# Patient Record
Sex: Male | Born: 1950 | Race: Black or African American | Hispanic: No | Smoking: Current every day smoker
Health system: Southern US, Community
[De-identification: ages and names within clinical notes are randomized; demographics above are authoritative.]

## PROBLEM LIST (undated history)

## (undated) DIAGNOSIS — I10 Essential (primary) hypertension: Secondary | ICD-10-CM

## (undated) DIAGNOSIS — E119 Type 2 diabetes mellitus without complications: Secondary | ICD-10-CM

## (undated) DIAGNOSIS — I509 Heart failure, unspecified: Secondary | ICD-10-CM

---

## 1984-07-07 HISTORY — PX: ANKLE FRACTURE SURGERY: SHX122

## 1984-07-07 HISTORY — PX: WRIST FRACTURE SURGERY: SHX121

## 1984-07-07 HISTORY — PX: ORIF TRIPOD FRACTURE: SHX5211

## 2002-11-25 ENCOUNTER — Encounter: Payer: Self-pay | Admitting: Emergency Medicine

## 2002-11-25 ENCOUNTER — Emergency Department (HOSPITAL_COMMUNITY): Admission: EM | Admit: 2002-11-25 | Discharge: 2002-11-25 | Payer: Self-pay | Admitting: Emergency Medicine

## 2007-12-16 ENCOUNTER — Emergency Department (HOSPITAL_COMMUNITY): Admission: EM | Admit: 2007-12-16 | Discharge: 2007-12-16 | Payer: Self-pay | Admitting: Emergency Medicine

## 2008-04-16 ENCOUNTER — Emergency Department (HOSPITAL_COMMUNITY): Admission: EM | Admit: 2008-04-16 | Discharge: 2008-04-16 | Payer: Self-pay | Admitting: Emergency Medicine

## 2010-08-07 DIAGNOSIS — I509 Heart failure, unspecified: Secondary | ICD-10-CM

## 2010-08-07 HISTORY — DX: Heart failure, unspecified: I50.9

## 2010-09-27 ENCOUNTER — Inpatient Hospital Stay (HOSPITAL_COMMUNITY)
Admission: EM | Admit: 2010-09-27 | Discharge: 2010-10-02 | DRG: 291 | Disposition: A | Discharge status: Home / Self Care | Payer: Self-pay | Attending: Family Medicine | Admitting: Family Medicine

## 2010-09-27 ENCOUNTER — Emergency Department (HOSPITAL_COMMUNITY): Payer: Self-pay

## 2010-09-27 DIAGNOSIS — I130 Hypertensive heart and chronic kidney disease with heart failure and stage 1 through stage 4 chronic kidney disease, or unspecified chronic kidney disease: Principal | ICD-10-CM | POA: Diagnosis present

## 2010-09-27 DIAGNOSIS — F17200 Nicotine dependence, unspecified, uncomplicated: Secondary | ICD-10-CM | POA: Diagnosis present

## 2010-09-27 DIAGNOSIS — I5023 Acute on chronic systolic (congestive) heart failure: Secondary | ICD-10-CM | POA: Diagnosis present

## 2010-09-27 DIAGNOSIS — K8020 Calculus of gallbladder without cholecystitis without obstruction: Secondary | ICD-10-CM | POA: Diagnosis present

## 2010-09-27 DIAGNOSIS — E876 Hypokalemia: Secondary | ICD-10-CM | POA: Diagnosis present

## 2010-09-27 DIAGNOSIS — I509 Heart failure, unspecified: Secondary | ICD-10-CM | POA: Diagnosis present

## 2010-09-27 DIAGNOSIS — Z7982 Long term (current) use of aspirin: Secondary | ICD-10-CM

## 2010-09-27 DIAGNOSIS — R7402 Elevation of levels of lactic acid dehydrogenase (LDH): Secondary | ICD-10-CM | POA: Diagnosis present

## 2010-09-27 DIAGNOSIS — R7401 Elevation of levels of liver transaminase levels: Secondary | ICD-10-CM | POA: Diagnosis present

## 2010-09-27 DIAGNOSIS — R0902 Hypoxemia: Secondary | ICD-10-CM | POA: Diagnosis present

## 2010-09-27 DIAGNOSIS — Z79899 Other long term (current) drug therapy: Secondary | ICD-10-CM

## 2010-09-27 DIAGNOSIS — N183 Chronic kidney disease, stage 3 unspecified: Secondary | ICD-10-CM | POA: Diagnosis present

## 2010-09-27 LAB — URINE MICROSCOPIC-ADD ON

## 2010-09-27 LAB — CBC
HCT: 50.9 % (ref 39.0–52.0)
MCHC: 35.4 g/dL (ref 30.0–36.0)
RDW: 13.8 % (ref 11.5–15.5)

## 2010-09-27 LAB — COMPREHENSIVE METABOLIC PANEL
ALT: 54 U/L — ABNORMAL HIGH (ref 0–53)
AST: 52 U/L — ABNORMAL HIGH (ref 0–37)
Alkaline Phosphatase: 128 U/L — ABNORMAL HIGH (ref 39–117)
CO2: 27 mEq/L (ref 19–32)
Chloride: 103 mEq/L (ref 96–112)
GFR calc Af Amer: 57 mL/min — ABNORMAL LOW (ref >60)
GFR calc non Af Amer: 47 mL/min — ABNORMAL LOW (ref >60)
Glucose, Bld: 105 mg/dL — ABNORMAL HIGH (ref 70–99)
Sodium: 138 mEq/L (ref 135–145)
Total Bilirubin: 1.5 mg/dL — ABNORMAL HIGH (ref 0.3–1.2)

## 2010-09-27 LAB — DIFFERENTIAL
Basophils Absolute: 0.1 10*3/uL (ref 0.0–0.1)
Basophils Relative: 1 % (ref 0–1)
Eosinophils Relative: 1 % (ref 0–5)
Lymphocytes Relative: 45 % (ref 12–46)
Monocytes Absolute: 0.9 10*3/uL (ref 0.1–1.0)

## 2010-09-27 LAB — URINALYSIS, ROUTINE W REFLEX MICROSCOPIC
Ketones, ur: NEGATIVE mg/dL
Nitrite: NEGATIVE
Protein, ur: 100 mg/dL — AB
Urobilinogen, UA: 1 mg/dL (ref 0.0–1.0)

## 2010-09-27 LAB — POCT CARDIAC MARKERS
CKMB, poc: <1 ng/mL — ABNORMAL LOW (ref 1.0–8.0)
Myoglobin, poc: 61.9 ng/mL (ref 12–200)
Troponin i, poc: <0.05 ng/mL (ref 0.00–0.09)

## 2010-09-28 ENCOUNTER — Inpatient Hospital Stay (HOSPITAL_COMMUNITY): Payer: Self-pay

## 2010-09-28 LAB — CARDIAC PANEL(CRET KIN+CKTOT+MB+TROPI)
CK, MB: 1.7 ng/mL (ref 0.3–4.0)
CK, MB: 1.8 ng/mL (ref 0.3–4.0)
CK, MB: 1.5 ng/mL (ref 0.3–4.0)
Relative Index: 1.2 (ref 0.0–2.5)
Total CK: 145 U/L (ref 7–232)
Total CK: 140 U/L (ref 7–232)
Troponin I: 0.07 ng/mL — ABNORMAL HIGH (ref 0.00–0.06)

## 2010-09-28 LAB — COMPREHENSIVE METABOLIC PANEL
ALT: 46 U/L (ref 0–53)
AST: 44 U/L — ABNORMAL HIGH (ref 0–37)
Alkaline Phosphatase: 100 U/L (ref 39–117)
CO2: 25 mEq/L (ref 19–32)
Chloride: 107 mEq/L (ref 96–112)
Creatinine, Ser: 1.42 mg/dL (ref 0.4–1.5)
GFR calc Af Amer: >60 mL/min (ref >60)
GFR calc non Af Amer: 51 mL/min — ABNORMAL LOW (ref >60)
Sodium: 138 mEq/L (ref 135–145)
Total Bilirubin: 1.4 mg/dL — ABNORMAL HIGH (ref 0.3–1.2)

## 2010-09-28 LAB — DIFFERENTIAL
Basophils Absolute: 0.1 10*3/uL (ref 0.0–0.1)
Lymphocytes Relative: 46 % (ref 12–46)
Monocytes Absolute: 0.6 10*3/uL (ref 0.1–1.0)
Neutro Abs: 2.1 10*3/uL (ref 1.7–7.7)

## 2010-09-28 LAB — CBC
HCT: 46.1 % (ref 39.0–52.0)
Hemoglobin: 16.1 g/dL (ref 13.0–17.0)
MCHC: 34.9 g/dL (ref 30.0–36.0)

## 2010-09-28 LAB — HEMOGLOBIN A1C: Mean Plasma Glucose: 126 mg/dL — ABNORMAL HIGH (ref <117)

## 2010-09-28 LAB — MAGNESIUM: Magnesium: 2.4 mg/dL (ref 1.5–2.5)

## 2010-09-28 LAB — TSH: TSH: 0.895 u[IU]/mL (ref 0.350–4.500)

## 2010-09-28 LAB — CK TOTAL AND CKMB (NOT AT ARMC): Relative Index: 1.1 (ref 0.0–2.5)

## 2010-09-28 MED ORDER — XENON XE 133 GAS
10.0000 | GAS_FOR_INHALATION | Freq: Once | RESPIRATORY_TRACT | Status: AC | PRN
  Administered 2010-09-28: 10 via RESPIRATORY_TRACT

## 2010-09-28 MED ORDER — TECHNETIUM TO 99M ALBUMIN AGGREGATED
6.0000 | Freq: Once | INTRAVENOUS | Status: AC | PRN
  Administered 2010-09-28: 6 via INTRAVENOUS

## 2010-09-29 LAB — COMPREHENSIVE METABOLIC PANEL
ALT: 37 U/L (ref 0–53)
AST: 29 U/L (ref 0–37)
Albumin: 3.1 g/dL — ABNORMAL LOW (ref 3.5–5.2)
Alkaline Phosphatase: 88 U/L (ref 39–117)
Chloride: 104 mEq/L (ref 96–112)
GFR calc Af Amer: 52 mL/min — ABNORMAL LOW (ref >60)
Potassium: 3.5 mEq/L (ref 3.5–5.1)
Total Bilirubin: 1.6 mg/dL — ABNORMAL HIGH (ref 0.3–1.2)

## 2010-09-29 LAB — CBC
Hemoglobin: 15 g/dL (ref 13.0–17.0)
MCH: 29.1 pg (ref 26.0–34.0)
MCV: 84.9 fL (ref 78.0–100.0)
RBC: 5.16 MIL/uL (ref 4.22–5.81)

## 2010-09-29 LAB — CARDIAC PANEL(CRET KIN+CKTOT+MB+TROPI): Relative Index: 1.2 (ref 0.0–2.5)

## 2010-09-29 LAB — RAPID URINE DRUG SCREEN, HOSP PERFORMED: Tetrahydrocannabinol: NOT DETECTED

## 2010-09-30 ENCOUNTER — Inpatient Hospital Stay (HOSPITAL_COMMUNITY): Payer: Self-pay

## 2010-09-30 LAB — MAGNESIUM: Magnesium: 2.3 mg/dL (ref 1.5–2.5)

## 2010-09-30 LAB — BASIC METABOLIC PANEL
Chloride: 102 mEq/L (ref 96–112)
Creatinine, Ser: 1.57 mg/dL — ABNORMAL HIGH (ref 0.4–1.5)
GFR calc Af Amer: 55 mL/min — ABNORMAL LOW (ref >60)
Potassium: 3.1 mEq/L — ABNORMAL LOW (ref 3.5–5.1)

## 2010-10-01 ENCOUNTER — Inpatient Hospital Stay (HOSPITAL_COMMUNITY): Payer: Self-pay

## 2010-10-01 LAB — BASIC METABOLIC PANEL
GFR calc non Af Amer: 53 mL/min — ABNORMAL LOW (ref >60)
Glucose, Bld: 102 mg/dL — ABNORMAL HIGH (ref 70–99)
Potassium: 3.3 mEq/L — ABNORMAL LOW (ref 3.5–5.1)
Sodium: 133 mEq/L — ABNORMAL LOW (ref 135–145)

## 2010-10-01 LAB — CBC
HCT: 44.3 % (ref 39.0–52.0)
Hemoglobin: 15 g/dL (ref 13.0–17.0)
RDW: 13.9 % (ref 11.5–15.5)
WBC: 5 10*3/uL (ref 4.0–10.5)

## 2010-10-01 MED ORDER — TECHNETIUM TC 99M TETROFOSMIN IV KIT
10.0000 | PACK | Freq: Once | INTRAVENOUS | Status: AC | PRN
  Administered 2010-10-01: 10 via INTRAVENOUS

## 2010-10-01 MED ORDER — TECHNETIUM TC 99M TETROFOSMIN IV KIT
30.0000 | PACK | Freq: Once | INTRAVENOUS | Status: AC | PRN
  Administered 2010-10-01: 30 via INTRAVENOUS

## 2010-10-02 ENCOUNTER — Other Ambulatory Visit (HOSPITAL_COMMUNITY): Payer: Self-pay

## 2010-10-02 LAB — CBC
Hemoglobin: 15.4 g/dL (ref 13.0–17.0)
MCH: 29.7 pg (ref 26.0–34.0)
RBC: 5.18 MIL/uL (ref 4.22–5.81)
WBC: 6 10*3/uL (ref 4.0–10.5)

## 2010-10-02 LAB — BASIC METABOLIC PANEL
CO2: 26 mEq/L (ref 19–32)
Chloride: 105 mEq/L (ref 96–112)
Creatinine, Ser: 1.62 mg/dL — ABNORMAL HIGH (ref 0.4–1.5)
GFR calc Af Amer: 53 mL/min — ABNORMAL LOW (ref >60)
Potassium: 3.7 mEq/L (ref 3.5–5.1)
Sodium: 138 mEq/L (ref 135–145)

## 2010-10-03 NOTE — H&P (Signed)
[name] Barnett, [name] Barnett                  ACCOUNT NO.:  [id number]  MEDICAL RECORD NO.:  [id number]           PATIENT TYPE:  E  LOCATION:  [facility]                         FACILITY:  [facility]  PHYSICIAN:  Michiel Cowboy, MDDATE OF BIRTH:  1951/03/21  DATE OF ADMISSION:  09/27/2010 DATE OF DISCHARGE:                             HISTORY & PHYSICAL   PRIMARY CARE PROVIDER:  None.  CHIEF COMPLAINT:  Shortness of breath and leg swelling.  The patient is a 60 year old gentleman, who since January has been having worsening shortness of breath with exertion as well as trouble sleeping flat with upright propped up for the past 2 weeks.  He had been also having worsening lower extremity edema and shortness of breath.  He started to use his girlfriend's oxygen, which seem to have helped him. He endorses occasional chest pains, though none today, they are more likely chest aches.  He was seen in the emergency department by Dr. Shana Chute who wrote for IV Lasix and recommends a cardiac workup for possible CHF.  REVIEW OF SYSTEMS:  He does have occasional cough especially at night. Otherwise, no nausea, vomiting, or diarrhea.  Review of systems otherwise negative except for HPI.  PAST MEDICAL HISTORY:  The patient is unaware of any.  SOCIAL HISTORY:  The patient continues to smoke, thinking about quitting now.  Does not abuse drugs, but drinks about a beer a day.  FAMILY HISTORY:  Noncontributory.  No coronary artery disease that he knows of.  ALLERGIES:  None.  MEDICATIONS:  None.  PHYSICAL EXAMINATION:  VITALS:  Temperature 98.2, blood pressure 135/101, respiration 20.  Initial heart rate 101, now down to 93.  Pulse ox 99% on 2 liters and 90% on room air. GENERAL:  The patient appears to be in no acute distress. HEENT:  Head, nontraumatic.  Moist mucous membranes. LUNGS:  I do not appreciate any crackles or wheezes. HEART:  Regular rhythm.  No murmurs appreciated. ABDOMEN:  Soft,  nontender, nondistended. LOWER EXTREMITIES:  About 1+ edema bilaterally. NEUROLOGIC:  Grossly intact.  LABS:  White blood cell count 6.1, hemoglobin 18.  Sodium 138, potassium 5.1, creatinine 1.3, total bili 1.5, alk phos 128, AST 52, ALT 54 and slightly elevated, albumin 3.8, BNP elevated to 1798.  EKG showing heart rate of 95, left ventricular hypertrophy with no reciprocal ST depressions, but there is some ST changes in V1, V2, and V3, reviewed by Cardiology.  ASSESSMENT AND PLAN:  This is a 60 year old gentleman with possible congestive heart failure presentation.  We will admit cycle cardiac markers.  Continue Lasix IV, written by Cardiology.  Write for 2-D echo, TSH, check hemoglobin A1c, fasting lipid panel, UDS.  Slight LFT elevations, could be secondary to liver parenchyma structure secondary to heart failure, most likely right-sided, but given history of alcohol use, we will check abdominal ultrasound.  Prophylaxis, Protonix and Lovenox.  Hypoxia.  We will check a D-dimer.  His chest x-ray was not consistent with pulmonary edema.  I wonder if he may have some COPD contribution, although he is not wheezing.  We will check a D-dimer for  completion sake.  We will write for p.r.n. albuterol and scheduled Atrovent to see if this helps him.     Michiel Cowboy, MD     AVD/MEDQ  D:  09/27/2010  T:  09/28/2010  Job:  [id number]  cc:   Osvaldo Shipper. Spruill, M.D.  Electronically Signed by Therisa Doyne MD on 10/03/2010 02:40:18 AM

## 2010-10-09 NOTE — Discharge Summary (Signed)
[name] Barnett, [name] Barnett                  ACCOUNT NO.:  [id number]  MEDICAL RECORD NO.:  [id number]           PATIENT TYPE:  I  LOCATION:  4708                         FACILITY:  [facility]  PHYSICIAN:  Mauro Kaufmann, MD         DATE OF BIRTH:  1951/01/21  DATE OF ADMISSION:  09/27/2010 DATE OF DISCHARGE:  10/02/2010                              DISCHARGE SUMMARY   ADMISSION DIAGNOSES: 1. Congestive heart failure exacerbation. 2. Transaminitis.  DISCHARGE DIAGNOSES: 1. Congestive heart failure exacerbation. resolved. 2. Transaminitis, resolved. 3. Negative nuclear stress test. 4. Hypertension. 5. Cholelithiasis with no evidence of acute infection. 6. Tobacco abuse. 7. Chronic kidney disease stage III.  CONSULTATIONS OBTAINED DURING STAY:  Cardiology consultation.  TESTS PERFORMED DURING THE HOSPITAL STAY: 1. Chest x-ray on March 23 showed cardiomegaly without CHF or     pneumonia. 2. Abdomen ultrasound on March 24 showed sludge and small stones in     the gallbladder.  No evidence of acute cholecystitis. 3. Pulmonary perfusion scan on March 24 showed low probability for     pulmonary embolism. 4. Myocardial Myoview stress test showed no evidence of reversible     ischemia or infarction.  Left ventricular dilatation with global     hypokinesia.  Left ventricular ejection fraction of 29%.  Findings     suggestive of dilated cardiomyopathy.  BRIEF HISTORY AND PHYSICAL:  This is a 60 year old gentleman who came to the hospital with shortness of breath, exertion and sleeping flat with upright propped up for past 2 weeks and the patient also had worsening of the lower extremity edema and shortness of breath.  The patient started to use girlfriend's oxygen, it seem to have helped him.  The patient was found to have CHF exacerbation and was started on IV Lasix and was admitted for further evaluation.  BRIEF HOSPITAL COURSE: 1. CHF exacerbation.  The patient actually had echocardiogram done  on     March 24, which showed EF of 20-25%, diffuse hypokinesis.  The     patient also had a myocardial stress test which showed EF of 29%     and showed no reversible ischemia.  Cardiology followed the patient     and at this time Cardiology happen to send the patient home on     medications and to follow up with the patient 3 months' time to     assess the patient's ejection fraction by doing an echocardiogram     and this patient needs AICD and whether with the patient needs     AICD, so he will follow up with Dr. Sharyn Lull as outpatient in 3     months' time.  In the meantime, the patient will be sent home on     Lasix, spironolactone, Benicar as well as a isosorbide hydralazine.     The patient already has an appointment set up at the [facility]     and follow up with them as outpatient. 2. Transaminitis.  The patient admitted and was found to have elevated     liver enzymes, which was thought to be  due to the hepatopathy     because of the CHF.  At this time, his liver enzymes have resolved.     The patient was found to have mild sludge in the gallbladder with     small stones, but there is no active issue or no active     cholecystitis at this time.  The patient at this time has no active     issue with the gallstones and this needs to be monitor as long     term. 3. Chronic kidney disease stage III.  The patient has a CKD history.     History is stable at this time. 4. Hypertension.  Blood pressure is stable.  He will continue on     Benicar, hydralazine, and Coreg as ordered.  MEDICATIONS ON DISCHARGE: 1. Aspirin 325 mg p.o. daily. 2. Coreg 12.5 mg p.o. b.i.d. 3. BiDil 1 tablet p.o. t.i.d. 4. Lasix 40 mg p.o. twice a day. 5. Olmesartan 200 mg p.o. daily. 6. Potassium chloride 40 mEq p.o. daily. 7. Spironolactone 25 mg p.o. daily. 8. Mucinex 600 mg 1 tablet p.o. daily. 9. Darvocet as needed. 10.Pepto-Bismol 2 tablets by mouth every 8 hours as needed.  At this time, the  patient is stable for discharge.     Mauro Kaufmann, MD     GL/MEDQ  D:  10/02/2010  T:  10/03/2010  Job:  [id number]  cc:   Eduardo Osier. Sharyn Lull, M.D. Dr. Daphine Deutscher  Electronically Signed by Mauro Kaufmann  on 10/09/2010 02:37:09 PM

## 2010-10-21 NOTE — Consult Note (Signed)
[name] Barnett, [name] Barnett                  ACCOUNT NO.:  [id number]  MEDICAL RECORD NO.:  [id number]           PATIENT TYPE:  I  LOCATION:  4708                         FACILITY:  [facility]  PHYSICIAN:  Osvaldo Shipper. Raeford Brandenburg, M.D.DATE OF BIRTH:  Nov 04, 1950  DATE OF CONSULTATION:  09/27/2010 DATE OF DISCHARGE:                                CONSULTATION   HISTORY:  This 60 year old gentleman presented to the hospital emergency room with complaints of progressive dyspnea.  The patient and his wife were present at the time of exam and reports that the patient began to experience shortness of breath approximately 2 months ago.  The patient reports that his shortness of breath has gotten progressively worse over the past few days and now has resulted in difficulty breathing at night and nocturnal cough.  The patient also reports leg swelling which has occurred the past 2-3 weeks as well as some substernal chest discomfort as he has experienced.  The patient reports that the chest discomfort is of brief duration but has been associated with some diaphoresis as well as some shortness of breath.  The patient denies any prior episodes of chest discomfort or symptoms of this nature in his lifetime, but has developed this recently.  The patient came to the hospital emergency room, was evaluated, and found to have evidence of cardiac decompensation and was referred for admission to the hospital.  PAST MEDICAL HISTORY:  Significant for hypertension.  SOCIAL HISTORY:  The patient currently lives with his wife.  He denies any drug abuse.  He smokes approximately 2 packs per day and drinks a beer daily.  ALLERGIES:  No known allergies.  MEDICATIONS:  No medications at this time.  REVIEW OF SYSTEMS:  Significant for recent onset of shortness of breath with exertion.  CARDIOVASCULAR:  Significant for some mild substernal chest discomfort associated with some diaphoresis.  RESPIRATORY: Shortness of breath  now on exertion and at rest, nocturnal cough.  The patient also complains of nocturnal dyspnea.  GASTROINTESTINAL:  Denies any nausea, vomiting, or diarrhea.  No hematemesis.  MUSCULOSKELETAL: Briefly has noted lower leg swelling.  DERMATOLOGIC:  No rash appreciated.  PHYSICAL EXAMINATION:  VITAL SIGNS:  Blood pressure 135/101, temperature 98.2, respirations 20, heart rate 101. GENERAL:  A well-developed male in moderate distress. HEENT:  Cranium, normocephalic.  No evidence of any recent trauma. Eyes, ears, nose, and throat unremarkable. NECK:  Positive for hepatojugular reflux.  No carotid bruit appreciated. No thyromegaly. THORAX:  No chest wall tenderness noted. LUNGS:  Decreased breath sounds in both bases. CARDIOVASCULAR:  S1 and S2 normal.  There is an S3 gallop.  No rubs. The patient does have a grade 2/6 holosystolic murmur at the left sternal border. ABDOMEN:  Soft.  Normoactive bowel sounds.  He does have a positive hepatojugular reflux. EXTERNAL GENITALIA:  Normal. EXTREMITIES:  Pitting edema 2 to 3+.  Peripheral pulses 1 to 2+ bilaterally.  Extremities are warm.  LABORATORY STUDIES:  The patient's myoglobin 61.9, CK-MB less than 1, and troponin less than 0.05.  Urinalysis was amber clear.  Urine glucose was negative.  Serum sodium  138, potassium 5.1, chloride 103, CO2 content 27, glucose 105, BUN 13, and creatinine 1.52.  Beta natriuretic peptide 2798.  White blood cell count 6100, hemoglobin 18, and hematocrit 50.9.  Chest x-ray:  Mild cardiac enlargement without heart failure.  No pneumonia or edema.  No effusion or pneumothorax.  ASSESSMENT: 1. The patient has also developed heart failure.  His beta natriuretic     peptide is greater than 2500.  He had complaints of typical     symptoms of heart failure with paroxysmal nocturnal dyspnea,     nocturnal cough, and peripheral edema.  In addition, the patient     also has hypertension. 2. Possible cause for the  patient's heart failure may include alcohol-     related, must exclude the possibility of acute myocardial     infarction or viral etiology.  PLAN:  We will admit the patient to the hospital, begin treatment with intravenous diuretics as well as initiation of an angiotensin receptor blocker and possible use of BiDil as well as appropriate anticoagulation.  The patient will also need some assessment of his LV function.  He may eventually require a stress test and an echocardiogram will also be ordered.     Osvaldo Shipper. Sixto Bowdish, M.D.     JOS/MEDQ  D:  09/27/2010  T:  09/28/2010  Job:  [id number]  Electronically Signed by Donia Guiles M.D. on 10/21/2010 08:44:35 PM

## 2011-04-03 LAB — GC/CHLAMYDIA PROBE AMP, GENITAL
Chlamydia, DNA Probe: NEGATIVE
GC Probe Amp, Genital: NEGATIVE

## 2011-07-02 ENCOUNTER — Other Ambulatory Visit: Payer: Self-pay | Admitting: Cardiology

## 2011-10-21 ENCOUNTER — Other Ambulatory Visit: Payer: Self-pay | Admitting: Cardiology

## 2013-04-14 ENCOUNTER — Emergency Department (HOSPITAL_COMMUNITY)
Admission: EM | Admit: 2013-04-14 | Discharge: 2013-04-15 | Disposition: A | Discharge status: Home / Self Care | Payer: Self-pay | Attending: Emergency Medicine | Admitting: Emergency Medicine

## 2013-04-14 ENCOUNTER — Emergency Department (HOSPITAL_COMMUNITY): Payer: Self-pay

## 2013-04-14 ENCOUNTER — Encounter (HOSPITAL_COMMUNITY): Payer: Self-pay | Admitting: Emergency Medicine

## 2013-04-14 DIAGNOSIS — Z79899 Other long term (current) drug therapy: Secondary | ICD-10-CM | POA: Insufficient documentation

## 2013-04-14 DIAGNOSIS — F17200 Nicotine dependence, unspecified, uncomplicated: Secondary | ICD-10-CM | POA: Insufficient documentation

## 2013-04-14 DIAGNOSIS — I509 Heart failure, unspecified: Secondary | ICD-10-CM | POA: Insufficient documentation

## 2013-04-14 DIAGNOSIS — M545 Low back pain, unspecified: Secondary | ICD-10-CM | POA: Insufficient documentation

## 2013-04-14 DIAGNOSIS — M549 Dorsalgia, unspecified: Secondary | ICD-10-CM

## 2013-04-14 HISTORY — DX: Heart failure, unspecified: I50.9

## 2013-04-14 MED ORDER — DIAZEPAM 5 MG PO TABS: 5.0000 mg | ORAL_TABLET | Freq: Four times a day (QID) | ORAL | Status: DC | PRN

## 2013-04-14 MED ORDER — HYDROCODONE-ACETAMINOPHEN 5-325 MG PO TABS: 1.0000 | ORAL_TABLET | ORAL | Status: DC | PRN

## 2013-04-14 MED ORDER — POLYETHYLENE GLYCOL 3350 17 GM/SCOOP PO POWD: 17.0000 g | Freq: Every day | ORAL | Status: DC

## 2013-04-14 MED ORDER — OXYCODONE-ACETAMINOPHEN 5-325 MG PO TABS
1.0000 | ORAL_TABLET | Freq: Once | ORAL | Status: AC
  Administered 2013-04-14: 1 via ORAL
  Filled 2013-04-14: qty 1

## 2013-04-14 NOTE — ED Notes (Signed)
Pt states that back pain started Monday and has gotten progressively worse. Pt denies lifting anything heavy but does a lot of twisting and pulling for work.

## 2013-04-15 MED ORDER — MORPHINE SULFATE 4 MG/ML IJ SOLN
4.0000 mg | Freq: Once | INTRAMUSCULAR | Status: AC
  Administered 2013-04-15: 4 mg via INTRAMUSCULAR
  Filled 2013-04-15: qty 1

## 2013-04-15 NOTE — ED Provider Notes (Signed)
Medical screening examination/treatment/procedure(s) were performed by non-physician practitioner and as supervising physician I was immediately available for consultation/collaboration.  Meilech Virts, MD 04/15/13 2320 

## 2013-04-15 NOTE — ED Provider Notes (Signed)
CSN: [id number]     Arrival date & time 04/14/13  2103 History   First MD Initiated Contact with Patient 04/14/13 2314     Chief Complaint  Patient presents with  . Back Pain   HPI  History provided by the patient. Patient is a 62 year old male presenting with complaints of right lower back.. Pains first began to worsen on Monday. The pain is a sharp aching pain in the right lower back that is worse with bending and certain movements. Currently patient reports having very little pain while sitting with his left leg crossed over his right. He states other positions cause pain to return. He denies any specific injury or trauma. He does do repetitive heavy lifting and pulling at work. Patient has tried to use over-the-counter pain medications without any relief. Pain does not radiate. He denies any associated weakness or numbness in the lower extremities. No urinary or fecal incontinence, urinary retention or perineal numbness. Denies any fever, chills or sweats. No weight changes. No other aggravating or alleviating factors. No other associated symptoms.    Past Medical History  Diagnosis Date  . CHF (congestive heart failure)    History reviewed. No pertinent past surgical history. History reviewed. No pertinent family history. History  Substance Use Topics  . Smoking status: Current Every Day Smoker  . Smokeless tobacco: Not on file  . Alcohol Use: Yes    Review of Systems  Constitutional: Negative for fever, chills, diaphoresis and unexpected weight change.  Gastrointestinal: Negative for vomiting and diarrhea.  Genitourinary: Negative for dysuria, frequency, hematuria and flank pain.  Musculoskeletal: Positive for back pain.  Neurological: Negative for weakness and numbness.  All other systems reviewed and are negative.    Allergies  Review of patient's allergies indicates no known allergies.  Home Medications   Current Outpatient Rx  Name  Route  Sig  Dispense  Refill  .  diazepam (VALIUM) 5 MG tablet   Oral   Take 1 tablet (5 mg total) by mouth every 6 (six) hours as needed for anxiety (spasms).   10 tablet   0   . HYDROcodone-acetaminophen (NORCO/VICODIN) 5-325 MG per tablet   Oral   Take 1-2 tablets by mouth every 4 (four) hours as needed for pain.   20 tablet   0   . polyethylene glycol powder (GLYCOLAX/MIRALAX) powder   Oral   Take 17 g by mouth daily.   255 g   0    BP 116/71  Pulse 59  Temp(Src) 97 F (36.1 C) (Oral)  Resp 20  SpO2 100% Physical Exam  Nursing note and vitals reviewed. Constitutional: He is oriented to person, place, and time. He appears well-developed and well-nourished. No distress.  HENT:  Head: Normocephalic and atraumatic.  Neck: Normal range of motion. Neck supple.  Cardiovascular: Normal rate and regular rhythm.   Pulmonary/Chest: Breath sounds normal. No respiratory distress. He has no wheezes. He has no rales.  Abdominal: Soft. There is no tenderness. There is no CVA tenderness.  No CVA tenderness  Musculoskeletal: Normal range of motion. He exhibits no edema and no tenderness.       Cervical back: Normal.       Thoracic back: Normal.       Lumbar back: He exhibits no tenderness, no bony tenderness and no swelling.       Back:  Neurological: He is alert and oriented to person, place, and time. He has normal strength. No sensory deficit. Gait normal.  Skin: Skin is warm.  Psychiatric: He has a normal mood and affect.    ED Course  Procedures       Imaging Review Dg Lumbar Spine Complete  04/14/2013   *RADIOLOGY REPORT*  Clinical Data: Lower back pain for 3 days.  LUMBAR SPINE - COMPLETE 4+ VIEW  Comparison: Chest radiograph performed 09/27/2010  Findings: There is a mild anterior compression deformity at vertebral body T12.  On correlation with the chest radiograph from 2012, this appears chronic in nature, though it is better characterized on the current study.  There is no evidence of acute  fracture or subluxation.  Vertebral bodies demonstrate normal height and alignment.  Intervertebral disc spaces are preserved.  The visualized neural foramina are grossly unremarkable in appearance.  The visualized bowel gas pattern is unremarkable in appearance; air and stool are noted within the colon.  The sacroiliac joints are within normal limits.  IMPRESSION: No evidence of acute fracture or subluxation along the lumbar spine.  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   Original Report Authenticated By: Tonia Ghent, M.D.      MDM   1. Back pain    Patient seen and evaluated. Currently appears well in no acute distress or discomfort. Pain is reproduced with certain movements. No pain on palpation of exam. No urinary complaints. Pain is not constant. No concerning or red flag symptoms.  X-rays reviewed. No acute findings. Patient given pain medications in the emergency room. Initially he denied significant improvements from Percocet. We'll give one dose of IM morphine. Patient will continue pain and muscle relaxer medications at home and followup with PCP.     Angus Seller, PA-C 04/15/13 0530

## 2013-11-08 ENCOUNTER — Inpatient Hospital Stay (HOSPITAL_COMMUNITY)
Admission: EM | Admit: 2013-11-08 | Discharge: 2013-11-10 | DRG: 313 | Disposition: A | Discharge status: Home / Self Care | Payer: Self-pay | Attending: Cardiology | Admitting: Cardiology

## 2013-11-08 ENCOUNTER — Encounter (HOSPITAL_COMMUNITY): Payer: Self-pay | Admitting: Emergency Medicine

## 2013-11-08 ENCOUNTER — Emergency Department (HOSPITAL_COMMUNITY): Payer: Self-pay

## 2013-11-08 DIAGNOSIS — I110 Hypertensive heart disease with heart failure: Secondary | ICD-10-CM | POA: Diagnosis present

## 2013-11-08 DIAGNOSIS — F17200 Nicotine dependence, unspecified, uncomplicated: Secondary | ICD-10-CM | POA: Diagnosis present

## 2013-11-08 DIAGNOSIS — E7800 Pure hypercholesterolemia, unspecified: Secondary | ICD-10-CM | POA: Diagnosis present

## 2013-11-08 DIAGNOSIS — F1010 Alcohol abuse, uncomplicated: Secondary | ICD-10-CM | POA: Diagnosis present

## 2013-11-08 DIAGNOSIS — E119 Type 2 diabetes mellitus without complications: Secondary | ICD-10-CM | POA: Diagnosis present

## 2013-11-08 DIAGNOSIS — I428 Other cardiomyopathies: Secondary | ICD-10-CM | POA: Diagnosis present

## 2013-11-08 DIAGNOSIS — I5022 Chronic systolic (congestive) heart failure: Secondary | ICD-10-CM | POA: Diagnosis present

## 2013-11-08 DIAGNOSIS — R0789 Other chest pain: Principal | ICD-10-CM | POA: Diagnosis present

## 2013-11-08 DIAGNOSIS — R079 Chest pain, unspecified: Secondary | ICD-10-CM | POA: Diagnosis present

## 2013-11-08 DIAGNOSIS — I509 Heart failure, unspecified: Secondary | ICD-10-CM | POA: Diagnosis present

## 2013-11-08 HISTORY — DX: Essential (primary) hypertension: I10

## 2013-11-08 HISTORY — DX: Type 2 diabetes mellitus without complications: E11.9

## 2013-11-08 LAB — CBC
HCT: 49.3 % (ref 39.0–52.0)
HEMOGLOBIN: 17.5 g/dL — AB (ref 13.0–17.0)
MCH: 32.1 pg (ref 26.0–34.0)
MCHC: 35.5 g/dL (ref 30.0–36.0)
MCV: 90.3 fL (ref 78.0–100.0)
Platelets: 280 10*3/uL (ref 150–400)
RBC: 5.46 MIL/uL (ref 4.22–5.81)
RDW: 15.2 % (ref 11.5–15.5)
WBC: 6.9 10*3/uL (ref 4.0–10.5)

## 2013-11-08 LAB — BASIC METABOLIC PANEL
BUN: 5 mg/dL — ABNORMAL LOW (ref 6–23)
CO2: 24 mEq/L (ref 19–32)
Calcium: 9.1 mg/dL (ref 8.4–10.5)
Chloride: 98 mEq/L (ref 96–112)
Creatinine, Ser: 1.02 mg/dL (ref 0.50–1.35)
GFR calc Af Amer: 88 mL/min — ABNORMAL LOW (ref >90)
GFR, EST NON AFRICAN AMERICAN: 76 mL/min — AB (ref >90)
GLUCOSE: 193 mg/dL — AB (ref 70–99)
Potassium: 3.6 mEq/L — ABNORMAL LOW (ref 3.7–5.3)
SODIUM: 138 meq/L (ref 137–147)

## 2013-11-08 LAB — I-STAT TROPONIN, ED: Troponin i, poc: 0 ng/mL (ref 0.00–0.08)

## 2013-11-08 LAB — PRO B NATRIURETIC PEPTIDE: PRO B NATRI PEPTIDE: 40.5 pg/mL (ref 0–125)

## 2013-11-08 MED ORDER — ASPIRIN 81 MG PO CHEW
324.0000 mg | CHEWABLE_TABLET | Freq: Once | ORAL | Status: AC
  Administered 2013-11-08: 324 mg via ORAL
  Filled 2013-11-08: qty 4

## 2013-11-08 MED ORDER — GI COCKTAIL ~~LOC~~
30.0000 mL | Freq: Once | ORAL | Status: AC
  Administered 2013-11-08: 30 mL via ORAL
  Filled 2013-11-08: qty 30

## 2013-11-08 NOTE — ED Provider Notes (Signed)
CSN: [id number]     Arrival date & time 11/08/13  2219 History   First MD Initiated Contact with Patient 11/08/13 2235     Chief Complaint  Patient presents with  . Chest Pain     (Consider location/radiation/quality/duration/timing/severity/associated sxs/prior Treatment) HPI  This a 63 year old male with history of CHF who presents with chest pain. Patient reports one week of chest pain. He states that it is worse at night. He states that every night at approximately 8 PM he has 10-15 minutes of chest pain that burns up into his neck. Tonight the pain did not go away. He denies any history of reflux. He denies any worsening of the pain with food. Currently he is pain-free. He has not taken aspirin today.  He denies any worsening on exertion. He did have associated shortness of breath. No recent fevers or cough. Of note, patient noted to have new lateral T wave inversions when compared to prior EKGs.  Past Medical History  Diagnosis Date  . CHF (congestive heart failure)    History reviewed. No pertinent past surgical history. History reviewed. No pertinent family history. History  Substance Use Topics  . Smoking status: Current Every Day Smoker -- 1.00 packs/day    Types: Cigarettes  . Smokeless tobacco: Not on file  . Alcohol Use: Yes    Review of Systems  Constitutional: Negative.  Negative for fever.  Respiratory: Positive for chest tightness. Negative for shortness of breath.   Cardiovascular: Positive for chest pain.  Gastrointestinal: Negative.  Negative for abdominal pain.  Genitourinary: Negative.  Negative for dysuria.  Musculoskeletal: Negative for back pain.  Neurological: Negative for headaches.  All other systems reviewed and are negative.     Allergies  Review of patient's allergies indicates no known allergies.  Home Medications   Prior to Admission medications   Medication Sig Start Date End Date Taking? Authorizing Provider  carvedilol (COREG) 25 MG  tablet Take 25 mg by mouth 2 (two) times daily with a meal.   Yes Historical Provider, MD  furosemide (LASIX) 40 MG tablet Take 40 mg by mouth 2 (two) times daily.   Yes Historical Provider, MD  lisinopril (PRINIVIL,ZESTRIL) 20 MG tablet Take 20 mg by mouth daily.   Yes Historical Provider, MD  spironolactone (ALDACTONE) 25 MG tablet Take 25 mg by mouth daily.   Yes Historical Provider, MD   BP 137/68  Pulse 75  Temp(Src) 98.5 F (36.9 C)  Resp 16  Ht 6' (1.829 m)  Wt 185 lb (83.915 kg)  BMI 25.08 kg/m2  SpO2 99% Physical Exam  Nursing note and vitals reviewed. Constitutional: He is oriented to person, place, and time. He appears well-developed and well-nourished. No distress.  HENT:  Head: Normocephalic and atraumatic.  Eyes: Pupils are equal, round, and reactive to light.  Neck: Neck supple. No JVD present.  Cardiovascular: Normal rate, regular rhythm and normal heart sounds.   No murmur heard. Pulmonary/Chest: Effort normal and breath sounds normal. No respiratory distress. He has no wheezes. He exhibits no tenderness.  Abdominal: Soft. Bowel sounds are normal. There is no tenderness. There is no rebound.  Musculoskeletal: He exhibits no edema.  Lymphadenopathy:    He has no cervical adenopathy.  Neurological: He is alert and oriented to person, place, and time.  Skin: Skin is warm and dry.  Psychiatric: He has a normal mood and affect.    ED Course  Procedures (including critical care time) Labs Review Labs Reviewed  CBC -  Abnormal; Notable for the following:    Hemoglobin 17.5 (*)    All other components within normal limits  BASIC METABOLIC PANEL - Abnormal; Notable for the following:    Potassium 3.6 (*)    Glucose, Bld 193 (*)    BUN 5 (*)    GFR calc non Af Amer 76 (*)    GFR calc Af Amer 88 (*)    All other components within normal limits  PRO B NATRIURETIC PEPTIDE  I-STAT TROPOININ, ED    Imaging Review Dg Chest 2 View  11/08/2013   CLINICAL DATA:  Mid  chest pain  EXAM: CHEST  2 VIEW  COMPARISON:  09/27/2010  FINDINGS: Normal heart size and mediastinal contours. No acute infiltrate or edema. No effusion or pneumothorax. No acute osseous findings.  IMPRESSION: No active cardiopulmonary disease.   Electronically Signed   By: Tiburcio Pea M.D.   On: 11/08/2013 23:06     EKG Interpretation   Date/Time:  Tuesday Nov 08 2013 22:21:08 EDT Ventricular Rate:  88 PR Interval:  134 QRS Duration: 96 QT Interval:  352 QTC Calculation: 425 R Axis:   67 Text Interpretation:  Normal sinus rhythm Possible Left atrial enlargement  Left ventricular hypertrophy T wave abnormality, consider anterolateral  ischemia T wave changes new vs 2012 Confirmed by Fayrene Fearing  MD, MARK ([id number])  on 11/08/2013 10:25:21 PM      MDM   Final diagnoses:  None    Patient presents with chest pain. History of nonischemic cardiomyopathy. Patient of Dr. Sharyn Lull. Chest pain somewhat atypical and sigmoid suggestive of GI etiology; however, patient with new lateral T wave inversions. Initial troponin negative and chest x-ray within normal limits. Patient reports improvement with GI cocktail. Discuss with Dr. Sharyn Lull. He recommends repeat troponin and if negative, close followup in the office. Patient signed out to Dr. Preston Fleeting.    Shon Baton, MD 11/10/13 [phone]

## 2013-11-08 NOTE — ED Notes (Signed)
Pt reports one week of cp, got worse tonight; a/w n/sob

## 2013-11-09 ENCOUNTER — Inpatient Hospital Stay (HOSPITAL_COMMUNITY): Payer: Self-pay

## 2013-11-09 ENCOUNTER — Encounter (HOSPITAL_COMMUNITY): Payer: Self-pay | Admitting: General Practice

## 2013-11-09 DIAGNOSIS — R079 Chest pain, unspecified: Secondary | ICD-10-CM | POA: Diagnosis present

## 2013-11-09 LAB — CBC WITH DIFFERENTIAL/PLATELET
Basophils Absolute: 0 10*3/uL (ref 0.0–0.1)
Basophils Relative: 0 % (ref 0–1)
Eosinophils Absolute: 0.1 10*3/uL (ref 0.0–0.7)
Eosinophils Relative: 2 % (ref 0–5)
HEMATOCRIT: 46.2 % (ref 39.0–52.0)
Hemoglobin: 15.9 g/dL (ref 13.0–17.0)
LYMPHS ABS: 2.5 10*3/uL (ref 0.7–4.0)
LYMPHS PCT: 46 % (ref 12–46)
MCH: 31.3 pg (ref 26.0–34.0)
MCHC: 34.4 g/dL (ref 30.0–36.0)
MCV: 90.9 fL (ref 78.0–100.0)
MONO ABS: 0.8 10*3/uL (ref 0.1–1.0)
Monocytes Relative: 14 % — ABNORMAL HIGH (ref 3–12)
NEUTROS ABS: 2.1 10*3/uL (ref 1.7–7.7)
NEUTROS PCT: 38 % — AB (ref 43–77)
Platelets: 258 10*3/uL (ref 150–400)
RBC: 5.08 MIL/uL (ref 4.22–5.81)
RDW: 15.2 % (ref 11.5–15.5)
WBC: 5.4 10*3/uL (ref 4.0–10.5)

## 2013-11-09 LAB — I-STAT TROPONIN, ED: Troponin i, poc: 0.01 ng/mL (ref 0.00–0.08)

## 2013-11-09 LAB — GLUCOSE, CAPILLARY
GLUCOSE-CAPILLARY: 167 mg/dL — AB (ref 70–99)
GLUCOSE-CAPILLARY: 213 mg/dL — AB (ref 70–99)
Glucose-Capillary: 173 mg/dL — ABNORMAL HIGH (ref 70–99)
Glucose-Capillary: 145 mg/dL — ABNORMAL HIGH (ref 70–99)

## 2013-11-09 LAB — HEPARIN LEVEL (UNFRACTIONATED)
HEPARIN UNFRACTIONATED: 0.22 [IU]/mL — AB (ref 0.30–0.70)
HEPARIN UNFRACTIONATED: 0.31 [IU]/mL (ref 0.30–0.70)

## 2013-11-09 LAB — COMPREHENSIVE METABOLIC PANEL
ALT: 8 U/L (ref 0–53)
AST: 10 U/L (ref 0–37)
Albumin: 3.1 g/dL — ABNORMAL LOW (ref 3.5–5.2)
Alkaline Phosphatase: 94 U/L (ref 39–117)
BUN: 6 mg/dL (ref 6–23)
CO2: 22 meq/L (ref 19–32)
CREATININE: 0.94 mg/dL (ref 0.50–1.35)
Calcium: 8.5 mg/dL (ref 8.4–10.5)
Chloride: 102 mEq/L (ref 96–112)
GFR calc Af Amer: >90 mL/min (ref >90)
GFR, EST NON AFRICAN AMERICAN: 87 mL/min — AB (ref >90)
Glucose, Bld: 191 mg/dL — ABNORMAL HIGH (ref 70–99)
POTASSIUM: 3.7 meq/L (ref 3.7–5.3)
Sodium: 139 mEq/L (ref 137–147)
Total Bilirubin: 0.6 mg/dL (ref 0.3–1.2)
Total Protein: 6.4 g/dL (ref 6.0–8.3)

## 2013-11-09 LAB — HEMOGLOBIN A1C
HEMOGLOBIN A1C: 8.1 % — AB (ref <5.7)
MEAN PLASMA GLUCOSE: 186 mg/dL — AB (ref <117)

## 2013-11-09 LAB — PROTIME-INR
INR: 1.05 (ref 0.00–1.49)
Prothrombin Time: 13.5 seconds (ref 11.6–15.2)

## 2013-11-09 LAB — LIPID PANEL
CHOL/HDL RATIO: 7.6 ratio
Cholesterol: 243 mg/dL — ABNORMAL HIGH (ref 0–200)
HDL: 32 mg/dL — AB (ref >39)
LDL Cholesterol: 164 mg/dL — ABNORMAL HIGH (ref 0–99)
Triglycerides: 235 mg/dL — ABNORMAL HIGH (ref <150)
VLDL: 47 mg/dL — ABNORMAL HIGH (ref 0–40)

## 2013-11-09 LAB — TROPONIN I
Troponin I: <0.3 ng/mL (ref <0.30)
Troponin I: <0.3 ng/mL (ref <0.30)

## 2013-11-09 LAB — MRSA PCR SCREENING: MRSA BY PCR: NEGATIVE

## 2013-11-09 LAB — APTT: APTT: 67 s — AB (ref 24–37)

## 2013-11-09 MED ORDER — ASPIRIN 81 MG PO CHEW
324.0000 mg | CHEWABLE_TABLET | ORAL | Status: AC
  Administered 2013-11-09: 324 mg via ORAL

## 2013-11-09 MED ORDER — NITROGLYCERIN IN D5W 200-5 MCG/ML-% IV SOLN
10.0000 ug/min | INTRAVENOUS | Status: DC
  Administered 2013-11-09: 10 ug/min via INTRAVENOUS
  Filled 2013-11-09: qty 250

## 2013-11-09 MED ORDER — TECHNETIUM TC 99M SESTAMIBI GENERIC - CARDIOLITE
10.0000 | Freq: Once | INTRAVENOUS | Status: AC | PRN
  Administered 2013-11-09: 10:00:00 10 via INTRAVENOUS

## 2013-11-09 MED ORDER — NITROGLYCERIN 0.4 MG SL SUBL: 0.4000 mg | SUBLINGUAL_TABLET | SUBLINGUAL | Status: DC | PRN

## 2013-11-09 MED ORDER — INSULIN ASPART 100 UNIT/ML ~~LOC~~ SOLN
0.0000 [IU] | Freq: Three times a day (TID) | SUBCUTANEOUS | Status: DC
  Administered 2013-11-09: 2 [IU] via SUBCUTANEOUS
  Administered 2013-11-09: 14:00:00 3 [IU] via SUBCUTANEOUS
  Administered 2013-11-09 – 2013-11-10 (×3): 2 [IU] via SUBCUTANEOUS

## 2013-11-09 MED ORDER — ASPIRIN EC 81 MG PO TBEC
81.0000 mg | DELAYED_RELEASE_TABLET | Freq: Every day | ORAL | Status: DC
  Administered 2013-11-10: 81 mg via ORAL
  Filled 2013-11-09: qty 1

## 2013-11-09 MED ORDER — HEPARIN (PORCINE) IN NACL 100-0.45 UNIT/ML-% IJ SOLN
1350.0000 [IU]/h | INTRAMUSCULAR | Status: DC
Start: 2013-11-09 — End: 2013-11-10
  Administered 2013-11-09: 1200 [IU]/h via INTRAVENOUS
  Administered 2013-11-09: 1450 [IU]/h via INTRAVENOUS
  Filled 2013-11-09 (×4): qty 250

## 2013-11-09 MED ORDER — REGADENOSON 0.4 MG/5ML IV SOLN
0.4000 mg | Freq: Once | INTRAVENOUS | Status: AC
  Administered 2013-11-09: 0.4 mg via INTRAVENOUS

## 2013-11-09 MED ORDER — PNEUMOCOCCAL VAC POLYVALENT 25 MCG/0.5ML IJ INJ
0.5000 mL | INJECTION | INTRAMUSCULAR | Status: DC
  Filled 2013-11-09: qty 0.5

## 2013-11-09 MED ORDER — SPIRONOLACTONE 25 MG PO TABS
25.0000 mg | ORAL_TABLET | Freq: Every day | ORAL | Status: DC
  Administered 2013-11-09 – 2013-11-10 (×2): 25 mg via ORAL
  Filled 2013-11-09 (×2): qty 1

## 2013-11-09 MED ORDER — LIVING WELL WITH DIABETES BOOK
Freq: Once | Status: AC
  Administered 2013-11-10: 1
  Filled 2013-11-09 (×2): qty 1

## 2013-11-09 MED ORDER — HEPARIN BOLUS VIA INFUSION
4000.0000 [IU] | Freq: Once | INTRAVENOUS | Status: AC
  Administered 2013-11-09: 4000 [IU] via INTRAVENOUS
  Filled 2013-11-09: qty 4000

## 2013-11-09 MED ORDER — LISINOPRIL 20 MG PO TABS
20.0000 mg | ORAL_TABLET | Freq: Every day | ORAL | Status: DC
  Administered 2013-11-09 – 2013-11-10 (×2): 20 mg via ORAL
  Filled 2013-11-09 (×2): qty 1

## 2013-11-09 MED ORDER — INSULIN ASPART 100 UNIT/ML ~~LOC~~ SOLN: 0.0000 [IU] | Freq: Every day | SUBCUTANEOUS | Status: DC

## 2013-11-09 MED ORDER — ONDANSETRON HCL 4 MG/2ML IJ SOLN: 4.0000 mg | Freq: Three times a day (TID) | INTRAMUSCULAR | Status: AC | PRN

## 2013-11-09 MED ORDER — REGADENOSON 0.4 MG/5ML IV SOLN
INTRAVENOUS | Status: AC
  Administered 2013-11-09: 11:00:00 0.4 mg via INTRAVENOUS
  Filled 2013-11-09: qty 5

## 2013-11-09 MED ORDER — ATORVASTATIN CALCIUM 80 MG PO TABS
80.0000 mg | ORAL_TABLET | Freq: Every day | ORAL | Status: DC
  Administered 2013-11-09: 19:00:00 80 mg via ORAL
  Filled 2013-11-09 (×2): qty 1

## 2013-11-09 MED ORDER — TECHNETIUM TC 99M SESTAMIBI GENERIC - CARDIOLITE
30.0000 | Freq: Once | INTRAVENOUS | Status: AC | PRN
  Administered 2013-11-09: 30 via INTRAVENOUS

## 2013-11-09 MED ORDER — PANTOPRAZOLE SODIUM 40 MG PO TBEC
40.0000 mg | DELAYED_RELEASE_TABLET | Freq: Every day | ORAL | Status: DC
  Administered 2013-11-09 – 2013-11-10 (×2): 40 mg via ORAL
  Filled 2013-11-09 (×2): qty 1

## 2013-11-09 MED ORDER — NITROGLYCERIN IN D5W 200-5 MCG/ML-% IV SOLN: 5.0000 ug/min | INTRAVENOUS | Status: DC

## 2013-11-09 MED ORDER — CARVEDILOL 25 MG PO TABS
25.0000 mg | ORAL_TABLET | Freq: Two times a day (BID) | ORAL | Status: DC
  Filled 2013-11-09 (×3): qty 1

## 2013-11-09 MED ORDER — ASPIRIN 300 MG RE SUPP
300.0000 mg | RECTAL | Status: AC
  Filled 2013-11-09: qty 1

## 2013-11-09 NOTE — Progress Notes (Signed)
Patient had 4.66 sec pause on monitor patient c/o dizziness when I arrived to room bp is 122/72 he said he was doing this h;ome prior to coming in . Dr. Sharyn Lull notified and plan is d/c coreg and continue to monitor pads placed on chest for now .

## 2013-11-09 NOTE — ED Notes (Signed)
Pt reports recurrent central chest pain that is described as a burning sensation - pt states this pain usually occurs in the evening approx 7-8pm and again in the a.m. - pt reports some shortness of breath and usually relieves the pain w/ deep breathing. Pt in no acute distress, A&Ox4

## 2013-11-09 NOTE — ED Notes (Signed)
Pt given graham crackers and ginger ale per pt request - Dr. Wilkie Aye aware.

## 2013-11-09 NOTE — Progress Notes (Signed)
Subjective:  Patient denies any chest pain or shortness of breath. Denies any dizziness or lightheadedness. Noted to have pause of 4.66 second asymptomatic. Carvedilol has been held no further episodes of pauses.   Objective:  Vital Signs in the last 24 hours: Temp:  [98.1 F (36.7 C)-98.5 F (36.9 C)] 98.1 F (36.7 C) (05/06 0736) Pulse Rate:  [48-95] 90 (05/06 1139) Resp:  [11-22] 14 (05/06 0736) BP: (116-163)/(57-89) 122/66 mmHg (05/06 1139) SpO2:  [95 %-100 %] 98 % (05/06 0736) Weight:  [83.915 kg (185 lb)-86.8 kg (191 lb 5.8 oz)] 86.8 kg (191 lb 5.8 oz) (05/06 0407)  Intake/Output from previous day:   Intake/Output from this shift:    Physical Exam: Neck: no adenopathy, no carotid bruit, no JVD and supple, symmetrical, trachea midline Lungs: clear to auscultation bilaterally Heart: regular rate and rhythm, S1, S2 normal and Soft systolic murmur noted no S3 gallop Abdomen: soft, non-tender; bowel sounds normal; no masses,  no organomegaly Extremities: extremities normal, atraumatic, no cyanosis or edema  Lab Results:  Recent Labs  11/08/13 2230 11/09/13 0645  WBC 6.9 5.4  HGB 17.5* 15.9  PLT 280 258    Recent Labs  11/08/13 2230 11/09/13 0645  NA 138 139  K 3.6* 3.7  CL 98 102  CO2 24 22  GLUCOSE 193* 191*  BUN 5* 6  CREATININE 1.02 0.94    Recent Labs  11/09/13 0645 11/09/13 0918  TROPONINI <0.30 <0.30   Hepatic Function Panel  Recent Labs  11/09/13 0645  PROT 6.4  ALBUMIN 3.1*  AST 10  ALT 8  ALKPHOS 94  BILITOT 0.6    Recent Labs  11/09/13 0645  CHOL 243*   No results found for this basename: PROTIME,  in the last 72 hours  Imaging: Imaging results have been reviewed and Dg Chest 2 View  11/08/2013   CLINICAL DATA:  Mid chest pain  EXAM: CHEST  2 VIEW  COMPARISON:  09/27/2010  FINDINGS: Normal heart size and mediastinal contours. No acute infiltrate or edema. No effusion or pneumothorax. No acute osseous findings.  IMPRESSION: No  active cardiopulmonary disease.   Electronically Signed   By: Tiburcio Pea M.D.   On: 11/08/2013 23:06    Cardiac Studies:  Assessment/Plan:  Status post recurrent chest pain MI ruled out Hypertensive heart disease with systolic dysfunction Compensated systolic heart failure Status post sinus pauses and symptomatic probably secondary to meds rule out sick sinus syndrome Tobacco abuse History of alcohol abuse  hypercholesteremia New-onset diabetes mellitus Plan Check nuclear stress test results Hold Coreg for now  monitor CBGs Check hemoglobin A1c   LOS: 1 day    Robynn Pane 11/09/2013, 12:32 PM

## 2013-11-09 NOTE — Progress Notes (Signed)
ANTICOAGULATION CONSULT NOTE - Follow Up Consult  Pharmacy Consult for Heparin Indication: chest pain/ACS  No Known Allergies  Patient Measurements: Height: 6' (182.9 cm) Weight: 191 lb 5.8 oz (86.8 kg) IBW/kg (Calculated) : 77.6 Heparin Dosing Weight:   Vital Signs: Temp: 98.1 F (36.7 C) (05/06 0736) Temp src: Oral (05/06 0736) BP: 122/66 mmHg (05/06 1139) Pulse Rate: 90 (05/06 1139)  Labs:  Recent Labs  11/08/13 2230 11/09/13 0645 11/09/13 0918  HGB 17.5* 15.9  --   HCT 49.3 46.2  --   PLT 280 258  --   APTT  --  67*  --   LABPROT  --  13.5  --   INR  --  1.05  --   HEPARINUNFRC  --   --  0.22*  CREATININE 1.02 0.94  --   TROPONINI  --  <0.30 <0.30    Estimated Creatinine Clearance: 88.3 ml/min (by C-G formula based on Cr of 0.94).   Medications:  Scheduled:  . [START ON 11/10/2013] aspirin EC  81 mg Oral Daily  . atorvastatin  80 mg Oral q1800  . insulin aspart  0-5 Units Subcutaneous QHS  . insulin aspart  0-9 Units Subcutaneous TID WC  . lisinopril  20 mg Oral Daily  . pantoprazole  40 mg Oral Q0600  . spironolactone  25 mg Oral Daily    Assessment: 63yo male who has ruled out for MI, and just returned from Nuclear Stress Test.  HL is subtherapeutic at 0.22.  No bleeding problems noted.  Hg and pltc are wnl.  Goal of Therapy:  Heparin level 0.3-0.7 units/ml Monitor platelets by anticoagulation protocol: Yes   Plan:  1-  Increase Heparin to 1350 units/hr 2-  Check HL in 6hr  Marisue Humble, PharmD Clinical Pharmacist [facility]

## 2013-11-09 NOTE — Progress Notes (Signed)
ANTICOAGULATION CONSULT NOTE - Initial Consult  Pharmacy Consult for heparin  Indication: chest pain/ACS  No Known Allergies  Patient Measurements: Height: 6' (182.9 cm) Weight: 185 lb (83.915 kg) IBW/kg (Calculated) : 77.6 Heparin Dosing Weight:   Vital Signs: Temp: 98.5 F (36.9 C) (05/05 2221) BP: 134/68 mmHg (05/06 0245) Pulse Rate: 73 (05/06 0245)  Labs:  Recent Labs  11/08/13 2230  HGB 17.5*  HCT 49.3  PLT 280  CREATININE 1.02    Estimated Creatinine Clearance: 81.4 ml/min (by C-G formula based on Cr of 1.02).   Medical History: Past Medical History  Diagnosis Date  . CHF (congestive heart failure)     Medications:   (Not in a hospital admission)  Assessment: Recurrent am and pm chest pain. Troponin #1 is WNL. Heparin to begin per ACS protocol Goal of Therapy:  Heparin level 0.3-0.7 units/ml Monitor platelets by anticoagulation protocol: Yes   Plan:  Heparin 4000 unit bolus then 1200 units/hr  Heparin level in 6 hours.  Daily heparin and cbc to begin 5/7 with am labs if drip continues.   Janice Coffin 11/09/2013,3:07 AM

## 2013-11-09 NOTE — Progress Notes (Signed)
ANTICOAGULATION CONSULT NOTE - Follow Up Consult  Pharmacy Consult for Heparin Indication: chest pain/ACS  No Known Allergies  Patient Measurements: Height: 6' (182.9 cm) Weight: 180 lb (81.647 kg) IBW/kg (Calculated) : 77.6 Heparin Dosing Weight:   Vital Signs: Temp: 97.6 F (36.4 C) (05/06 1900) Temp src: Oral (05/06 1900) BP: 132/68 mmHg (05/06 1900) Pulse Rate: 64 (05/06 1900)  Labs:  Recent Labs  11/08/13 2230 11/09/13 0645 11/09/13 0918 11/09/13 1525 11/09/13 2018  HGB 17.5* 15.9  --   --   --   HCT 49.3 46.2  --   --   --   PLT 280 258  --   --   --   APTT  --  67*  --   --   --   LABPROT  --  13.5  --   --   --   INR  --  1.05  --   --   --   HEPARINUNFRC  --   --  0.22*  --  0.31  CREATININE 1.02 0.94  --   --   --   TROPONINI  --  <0.30 <0.30 <0.30  --     Estimated Creatinine Clearance: 88.3 ml/min (by C-G formula based on Cr of 0.94).   Medications:  Scheduled:  . [START ON 11/10/2013] aspirin EC  81 mg Oral Daily  . atorvastatin  80 mg Oral q1800  . insulin aspart  0-5 Units Subcutaneous QHS  . insulin aspart  0-9 Units Subcutaneous TID WC  . lisinopril  20 mg Oral Daily  . living well with diabetes book   Does not apply Once  . pantoprazole  40 mg Oral Q0600  . [START ON 11/10/2013] pneumococcal 23 valent vaccine  0.5 mL Intramuscular Tomorrow-1000  . spironolactone  25 mg Oral Daily    Assessment: 63yo male who has ruled out for MI, and just returned from Nuclear Stress Test.   PM HL = 0.31  Goal of Therapy:  Heparin level 0.3-0.7 units/ml Monitor platelets by anticoagulation protocol: Yes   Plan:  1-  Heparin to 1450 units/hr 2-  Follow up AM  Thank you. Okey Regal, PharmD [phone]

## 2013-11-09 NOTE — ED Provider Notes (Signed)
Patient is a seen and evaluated by Dr. Wilkie Aye. He has been having chest pains every morning and every evening and was being kept in the ED for repeat troponin. Repeat troponin has come back 0.01, but he had an additional episode of chest pain while in the ED. This pain resolved after about 5 minutes and he is currently pain free. This is been occurring over the last several weeks and I'm concerned about the pattern. I discussed the case with Dr. Sharyn Lull, who agrees to admit the patient and requests that he be started on heparin and nitroglycerin drips.  Dione Booze, MD 11/09/13 [phone]

## 2013-11-09 NOTE — H&P (Signed)
[name] Barnett. is an 63 y.o. male.   Chief Complaint: Recurrent retrosternal chest pain HPI: Patient is 63 year old male with past medical history significant for hypertensive heart disease with systolic dysfunction, history of congestive heart failure secondary to depressed LV systolic function EF approximately 20-25%, history of EtOH abuse, tobacco abuse, came to the ER complaining of recurrent retrosternal chest pain described as burning radiating to neck off and on lasting few minutes for last 1 week today pain was worst so decided to come to the ED. Patient denies any exertional chest pain he denies any nausea vomiting diaphoresis denies palpitation lightheadedness or syncope. Denies any history of PND orthopnea leg swelling. EKG done in the ER showed normal sinus rhythm with LVH with strain pattern versus anterolateral wall ischemia. Patient denies any relation of chest pain to food but states pain gets occasionally better with breathing.  Past Medical History  Diagnosis Date  . CHF (congestive heart failure)     History reviewed. No pertinent past surgical history.  History reviewed. No pertinent family history. Social History:  reports that he has been smoking Cigarettes.  He has been smoking about 1.00 pack per day. He does not have any smokeless tobacco history on file. He reports that he drinks alcohol. He reports that he does not use illicit drugs.  Allergies: No Known Allergies   (Not in a hospital admission)  Results for orders placed during the hospital encounter of 11/08/13 (from the past 48 hour(s))  CBC     Status: Abnormal   Collection Time    11/08/13 10:30 PM      Result Value Ref Range   WBC 6.9  4.0 - 10.5 K/uL   RBC 5.46  4.22 - 5.81 MIL/uL   Hemoglobin 17.5 (*) 13.0 - 17.0 g/dL   HCT 49.3  39.0 - 52.0 %   MCV 90.3  78.0 - 100.0 fL   MCH 32.1  26.0 - 34.0 pg   MCHC 35.5  30.0 - 36.0 g/dL   RDW 15.2  11.5 - 15.5 %   Platelets 280  150 - 400 K/uL  BASIC  METABOLIC PANEL     Status: Abnormal   Collection Time    11/08/13 10:30 PM      Result Value Ref Range   Sodium 138  137 - 147 mEq/L   Potassium 3.6 (*) 3.7 - 5.3 mEq/L   Chloride 98  96 - 112 mEq/L   CO2 24  19 - 32 mEq/L   Glucose, Bld 193 (*) 70 - 99 mg/dL   BUN 5 (*) 6 - 23 mg/dL   Creatinine, Ser 1.02  0.50 - 1.35 mg/dL   Calcium 9.1  8.4 - 10.5 mg/dL   GFR calc non Af Amer 76 (*) >90 mL/min   GFR calc Af Amer 88 (*) >90 mL/min   Comment: (NOTE)     The eGFR has been calculated using the CKD EPI equation.     This calculation has not been validated in all clinical situations.     eGFR's persistently <90 mL/min signify possible Chronic Kidney     Disease.  PRO B NATRIURETIC PEPTIDE     Status: None   Collection Time    11/08/13 10:30 PM      Result Value Ref Range   Pro B Natriuretic peptide (BNP) 40.5  0 - 125 pg/mL  Randolm Idol, ED     Status: None   Collection Time    11/08/13 10:34 PM  Result Value Ref Range   Troponin i, poc 0.00  0.00 - 0.08 ng/mL   Comment 3            Comment: Due to the release kinetics of cTnI,     a negative result within the first hours     of the onset of symptoms does not rule out     myocardial infarction with certainty.     If myocardial infarction is still suspected,     repeat the test at appropriate intervals.  Randolm Idol, ED     Status: None   Collection Time    11/09/13  1:34 AM      Result Value Ref Range   Troponin i, poc 0.01  0.00 - 0.08 ng/mL   Comment 3            Comment: Due to the release kinetics of cTnI,     a negative result within the first hours     of the onset of symptoms does not rule out     myocardial infarction with certainty.     If myocardial infarction is still suspected,     repeat the test at appropriate intervals.   Dg Chest 2 View  11/08/2013   CLINICAL DATA:  Mid chest pain  EXAM: CHEST  2 VIEW  COMPARISON:  09/27/2010  FINDINGS: Normal heart size and mediastinal contours. No acute  infiltrate or edema. No effusion or pneumothorax. No acute osseous findings.  IMPRESSION: No active cardiopulmonary disease.   Electronically Signed   By: Jorje Guild M.D.   On: 11/08/2013 23:06    Review of Systems  Constitutional: Negative for chills and weight loss.  Eyes: Negative for double vision and photophobia.  Respiratory: Positive for shortness of breath. Negative for cough, hemoptysis and sputum production.   Cardiovascular: Positive for chest pain. Negative for palpitations, orthopnea, claudication, leg swelling and PND.  Gastrointestinal: Negative for vomiting, abdominal pain and diarrhea.  Genitourinary: Negative for dysuria.  Neurological: Negative for dizziness and headaches.    Blood pressure 120/70, pulse 79, temperature 98.5 F (36.9 C), resp. rate 19, height 6' (1.829 m), weight 83.915 kg (185 lb), SpO2 98.00%. Physical Exam  Constitutional: He is oriented to person, place, and time.  HENT:  Head: Normocephalic and atraumatic.  Eyes: Conjunctivae are normal. Pupils are equal, round, and reactive to light. Left eye exhibits no discharge. No scleral icterus.  Neck: Normal range of motion. Neck supple. No JVD present. No tracheal deviation present. No thyromegaly present.  Cardiovascular: Normal rate and regular rhythm.   Murmur (2/6 systolic murmur noted) heard. Respiratory: Effort normal and breath sounds normal. No respiratory distress. He has no wheezes. He has no rales.  GI: Soft. Bowel sounds are normal. He exhibits no distension. There is no tenderness. There is no rebound.  Musculoskeletal: He exhibits no edema and no tenderness.  Neurological: He is alert and oriented to person, place, and time.     Assessment/Plan Recurrent chest pain with new  EKG changes rule out MI although some features very atypical. Hypertensive heart disease with systolic dysfunction Compensated systolic heart failure Tobacco abuse EtOH abuse Plan As per orders Schedule for  nuclear stress test in a.m.  Allegra Lai Jaeline Whobrey 11/09/2013, 3:26 AM

## 2013-11-10 LAB — GLUCOSE, CAPILLARY
GLUCOSE-CAPILLARY: 158 mg/dL — AB (ref 70–99)
Glucose-Capillary: 176 mg/dL — ABNORMAL HIGH (ref 70–99)

## 2013-11-10 LAB — HEPARIN LEVEL (UNFRACTIONATED): Heparin Unfractionated: 0.74 IU/mL — ABNORMAL HIGH (ref 0.30–0.70)

## 2013-11-10 LAB — CBC
HCT: 45.2 % (ref 39.0–52.0)
HEMOGLOBIN: 15.4 g/dL (ref 13.0–17.0)
MCH: 30.9 pg (ref 26.0–34.0)
MCHC: 34.1 g/dL (ref 30.0–36.0)
MCV: 90.8 fL (ref 78.0–100.0)
Platelets: 242 10*3/uL (ref 150–400)
RBC: 4.98 MIL/uL (ref 4.22–5.81)
RDW: 15.1 % (ref 11.5–15.5)
WBC: 6.7 10*3/uL (ref 4.0–10.5)

## 2013-11-10 MED ORDER — METFORMIN HCL ER 500 MG PO TB24: 500.0000 mg | ORAL_TABLET | Freq: Every day | ORAL | Status: AC

## 2013-11-10 MED ORDER — LISINOPRIL 20 MG PO TABS: 20.0000 mg | ORAL_TABLET | Freq: Two times a day (BID) | ORAL | Status: AC

## 2013-11-10 MED ORDER — ASPIRIN 81 MG PO TBEC: 81.0000 mg | DELAYED_RELEASE_TABLET | Freq: Every day | ORAL | Status: AC

## 2013-11-10 MED ORDER — PANTOPRAZOLE SODIUM 40 MG PO TBEC: 40.0000 mg | DELAYED_RELEASE_TABLET | Freq: Every day | ORAL | Status: AC

## 2013-11-10 MED ORDER — ATORVASTATIN CALCIUM 80 MG PO TABS
80.0000 mg | ORAL_TABLET | Freq: Every day | ORAL | Status: AC
Start: 2013-11-10 — End: ?

## 2013-11-10 MED ORDER — LIVING WELL WITH DIABETES BOOK
Freq: Once | Status: AC
  Filled 2013-11-10: qty 1

## 2013-11-10 NOTE — Progress Notes (Signed)
Nutrition Consult  Received RD consult for new onset diabetes diet education. Patient in room dressing and getting ready to be discharged. RN with patient.  Lab Results  Component Value Date   HGBA1C 8.1* 11/09/2013    RD provided "Carbohydrate Counting for People with Diabetes" handout from the Academy of Nutrition and Dietetics. Provided list of carbohydrates and recommended serving sizes of common foods.  Briefly discussed importance of controlled and consistent carbohydrate intake throughout the day. Provided examples of ways to balance meals/snacks and encouraged intake of high-fiber, whole grain complex carbohydrates. Teach back method used.  Expect fair compliance. Will order OP diet education for further education and reinforcement.  Body mass index is 24.42 kg/(m^2). Pt meets criteria for normal weight based on current BMI.   Joaquin Courts, RD, LDN, CNSC Pager [phone] After Hours Pager [phone]

## 2013-11-10 NOTE — Discharge Instructions (Signed)
Chest Pain (Nonspecific) °It is often hard to give a specific diagnosis for the cause of chest pain. There is always a chance that your pain could be related to something serious, such as a heart attack or a blood clot in the lungs. You need to follow up with your caregiver for further evaluation. °CAUSES  °· Heartburn. °· Pneumonia or bronchitis. °· Anxiety or stress. °· Inflammation around your heart (pericarditis) or lung (pleuritis or pleurisy). °· A blood clot in the lung. °· A collapsed lung (pneumothorax). It can develop suddenly on its own (spontaneous pneumothorax) or from injury (trauma) to the chest. °· Shingles infection (herpes zoster virus). °The chest wall is composed of bones, muscles, and cartilage. Any of these can be the source of the pain. °· The bones can be bruised by injury. °· The muscles or cartilage can be strained by coughing or overwork. °· The cartilage can be affected by inflammation and become sore (costochondritis). °DIAGNOSIS  °Lab tests or other studies, such as X-rays, electrocardiography, stress testing, or cardiac imaging, may be needed to find the cause of your pain.  °TREATMENT  °· Treatment depends on what may be causing your chest pain. Treatment may include: °· Acid blockers for heartburn. °· Anti-inflammatory medicine. °· Pain medicine for inflammatory conditions. °· Antibiotics if an infection is present. °· You may be advised to change lifestyle habits. This includes stopping smoking and avoiding alcohol, caffeine, and chocolate. °· You may be advised to keep your head raised (elevated) when sleeping. This reduces the chance of acid going backward from your stomach into your esophagus. °· Most of the time, nonspecific chest pain will improve within 2 to 3 days with rest and mild pain medicine. °HOME CARE INSTRUCTIONS  °· If antibiotics were prescribed, take your antibiotics as directed. Finish them even if you start to feel better. °· For the next few days, avoid physical  activities that bring on chest pain. Continue physical activities as directed. °· Do not smoke. °· Avoid drinking alcohol. °· Only take over-the-counter or prescription medicine for pain, discomfort, or fever as directed by your caregiver. °· Follow your caregiver's suggestions for further testing if your chest pain does not go away. °· Keep any follow-up appointments you made. If you do not go to an appointment, you could develop lasting (chronic) problems with pain. If there is any problem keeping an appointment, you must call to reschedule. °SEEK MEDICAL CARE IF:  °· You think you are having problems from the medicine you are taking. Read your medicine instructions carefully. °· Your chest pain does not go away, even after treatment. °· You develop a rash with blisters on your chest. °SEEK IMMEDIATE MEDICAL CARE IF:  °· You have increased chest pain or pain that spreads to your arm, neck, jaw, back, or abdomen. °· You develop shortness of breath, an increasing cough, or you are coughing up blood. °· You have severe back or abdominal pain, feel nauseous, or vomit. °· You develop severe weakness, fainting, or chills. °· You have a fever. °THIS IS AN EMERGENCY. Do not wait to see if the pain will go away. Get medical help at once. Call your local emergency services (911 in [place] Do not drive yourself to the hospital. °MAKE SURE YOU:  °· Understand these instructions. °· Will watch your condition. °· Will get help right away if you are not doing well or get worse. °Document Released: 04/02/2005 Document Revised: 09/15/2011 Document Reviewed: 01/27/2008 °ExitCare® Patient Information ©2014 [employer]   [employer]. ° °

## 2013-11-10 NOTE — Progress Notes (Addendum)
Inpatient Diabetes Program Recommendations  AACE/ADA: New Consensus Statement on Inpatient Glycemic Control  Target Ranges:  Prepandial:   less than 140 mg/dL      Peak postprandial:   less than 180 mg/dL (1-2 hours)      Critically ill patients:  140 - 180 mg/dL  Pager:  [phone] Hours:  8 am-10pm   Reason for Visit: New Onset Diabetes    Inpatient Diabetes Program Recommendations  Oral Agents: Please order oral hypoglycemics and glucometer for patient prior to discharge Outpatient Referral: OPED referral placed  Note: Diabetes consult placed for patient due to new onset diabetes.  Patient stated he does not have PCP.  Ordered consult for care management to help him find PCP. Also ordered RD consult for diet education.  Ordered inpatient diabetes education and outpatient diabetes education.  Alfredia Client PhD, RN, BC-ADM Diabetes Coordinator  Office:  [phone] Team Pager:  [phone]

## 2013-11-10 NOTE — Progress Notes (Signed)
ANTICOAGULATION CONSULT NOTE - Follow Up Consult  Pharmacy Consult for Heparin Indication: chest pain/ACS  No Known Allergies  Patient Measurements: Height: 6' (182.9 cm) Weight: 180 lb 1.9 oz (81.7 kg) IBW/kg (Calculated) : 77.6 Heparin Dosing Weight: 81.7kg  Vital Signs: Temp: 97.9 F (36.6 C) (05/07 0555) Temp src: Oral (05/07 0555) BP: 127/69 mmHg (05/07 0555) Pulse Rate: 71 (05/07 0555)  Labs:  Recent Labs  11/08/13 2230 11/09/13 0645 11/09/13 0918 11/09/13 1525 11/09/13 2018 11/10/13 0543  HGB 17.5* 15.9  --   --   --  15.4  HCT 49.3 46.2  --   --   --  45.2  PLT 280 258  --   --   --  242  APTT  --  67*  --   --   --   --   LABPROT  --  13.5  --   --   --   --   INR  --  1.05  --   --   --   --   HEPARINUNFRC  --   --  0.22*  --  0.31 0.74*  CREATININE 1.02 0.94  --   --   --   --   TROPONINI  --  <0.30 <0.30 <0.30  --   --     Estimated Creatinine Clearance: 88.3 ml/min (by C-G formula based on Cr of 0.94).   Medications:  Heparin 1450 units/hr  Assessment: 63yom continues on heparin for CP/ACS. Heparin level (0.74) is now supratherapeutic after increasing overnight - will decrease heparin rate and follow-up heparin plan/continuation.  - H/H and Plts wnl - No significant bleeding reported  Goal of Therapy:  Heparin level 0.3-0.7 units/ml Monitor platelets by anticoagulation protocol: Yes   Plan:  1. Decrease heparin drip to 1350 units/hr (13.5 ml/hr) 2. Follow-up 6 hour heparin level and/or heparin continuation 3. Daily heparin level and CBC  Dannielle Karvonen [facility] [phone] 11/10/2013,8:27 AM

## 2013-11-10 NOTE — Progress Notes (Signed)
If you wish to quit smoking, help is available.  For free tobacco cessation program offerings call the [facility] at [phone] or [facility] at [phone]. You may also visit [email or web address] for more information and other programs.

## 2013-11-10 NOTE — Care Management (Signed)
1506 Late Entry 11-10-13 Appointment made at the [facility] for PCP and hospital f/u. No further needs from CM at this time. Lamar Laundry [place], RN,BSN [phone]

## 2013-11-10 NOTE — Discharge Summary (Signed)
  Discharge summary dictated on I7 2015 dictation number is [id number]

## 2013-11-11 NOTE — Discharge Summary (Signed)
NAMRolla Barnett:  Barnett, [name]                  ACCOUNT NO.:  [id number]  MEDICAL RECORD NO.:  [id number]  LOCATION:  [id number]                        FACILITY:  [facility]  PHYSICIAN:  [name] Barnett, M.D. DATE OF BIRTH:  1950-11-26  DATE OF ADMISSION:  11/08/2013 DATE OF DISCHARGE:  11/10/2013                              DISCHARGE SUMMARY   ADMITTING DIAGNOSES: 1. Recurrent chest pain with new EKG changes, rule out myocardial     infarction, although some features of atypical chest pain. 2. Hypertensive heart disease with systolic dysfunction. 3. Compensated systolic heart failure. 4. Tobacco abuse. 5. History of alcohol abuse.  DISCHARGE DIAGNOSES: 1. Status post chest pain, myocardial infarction ruled out, negative     nuclear stress test. 2. Hypertensive heart disease with normalization of systolic function     now as per stress report. 3. Compensated congestive heart failure. 4. Status post sinus pauses times asymptomatic secondary to     carvedilol. 5. Tobacco abuse. 6. History of alcohol abuse. 7. Hypercholesteremia. 8. New-onset diabetes mellitus.  DISCHARGE HOME MEDICATIONS: 1. Aspirin 81 mg 1 tablet daily. 2. Atorvastatin 80 mg 1 tablet daily. 3. Metformin extended release 500 mg 1 tablet daily by mouth with     breakfast. 4. Protonix 40 mg 1 tablet daily. 5. Lisinopril 20 mg 1 tablet twice daily. 6. Aldactone 25 mg 1 tablet daily.  The patient has been advised to     stop carvedilol and furosemide.  DIET:  Low salt, low cholesterol 1800 calories ADA diet.  ACTIVITY:  Increase activity slowly as tolerated.  CONDITION AT DISCHARGE:  Stable.  Follow up with me in 1 week.  BRIEF HISTORY:  Mr. [name] Barnett is a 63 year old male with past medical history significant for hypertensive heart disease with systolic dysfunction, history of congestive heart failure secondary to depressed LV systolic function, EF approximately 20-25%, history of alcohol abuse, tobacco abuse.  He came to the  ER complaining of recurrent retrosternal chest pain described as burning, radiating to neck off and on, lasting few minutes for last 1 week.  Today, the pain got worse, so decided to come to ED.  The patient denies any exertional chest pain.  He denies any nausea, vomiting, diaphoresis.  Denies palpitation, lightheadedness, or syncope.  Denies history of PND, orthopnea, or leg swelling.  EKG done in the ER showed normal sinus rhythm with LVH with strain pattern versus anterolateral wall ischemia.  The patient denies any relation of chest pain to food, but states pain gets occasionally better with deep breathing.  PAST MEDICAL HISTORY:  As above.  PHYSICAL EXAMINATION:  VITAL SIGNS:  His blood pressure was 120/70, pulse was 79, he was afebrile. HEENT:  Conjunctiva was pink. NECK:  Supple.  No JVD.  No bruits. LUNGS:  Clear to auscultation without rhonchi or rales. CARDIOVASCULAR:  S1, S2 was normal.  There was 2/6 systolic murmur noted. ABDOMEN:  Soft.  Bowel sounds were present.  Nontender. EXTREMITIES:  There is no clubbing, cyanosis, or edema.  LABORATORY DATA/IMAGING:  His 3 sets of troponin-I were negative. Sodium was 138, potassium 3.6, BUN 5, creatinine 1.02.  Glucose was 193. ProBNP was 40.5.  Hemoglobin was  17.5, hematocrit 49.3, repeat hemoglobin was 15.9, hematocrit 46.2, white count of 5.4, hemoglobin A1c is still pending.  Total cholesterol was 243, triglycerides were 235, HDL was low 32, LDL 164.  His blood sugar ranged between 160-200 during the hospital stay.  Nuclear stress test showed no evidence of inducible ischemia, improvement in left ventricular function with quantitative ejection fraction calculation was 60%, prior EF was 29% by nuclear stress test.  BRIEF HOSPITAL COURSE:  The patient was admitted to telemetry unit.  MI was ruled out by serial enzymes and EKG.  The patient did not had any episodes of chest pain, although had asymptomatic pauses up to  4 seconds.  Coreg was discontinued.  The patient did not had any further pauses during the hospital stay.  The patient subsequently underwent nuclear stress test, which showed no evidence of reversible ischemia with normalization of ejection fraction.  The patient is ambulating in room without any problems.  The patient will be discharged home on above medications and will be followed up in my office in 1 week.     [name] Osier. Sharyn Lull, M.D.     [facility]  D:  11/10/2013  T:  11/11/2013  Job:  [id number]

## 2013-11-14 ENCOUNTER — Emergency Department (HOSPITAL_COMMUNITY): Payer: Self-pay

## 2013-11-14 ENCOUNTER — Encounter (HOSPITAL_COMMUNITY): Payer: Self-pay | Admitting: Emergency Medicine

## 2013-11-14 DIAGNOSIS — E119 Type 2 diabetes mellitus without complications: Secondary | ICD-10-CM | POA: Insufficient documentation

## 2013-11-14 DIAGNOSIS — I509 Heart failure, unspecified: Secondary | ICD-10-CM | POA: Insufficient documentation

## 2013-11-14 DIAGNOSIS — F17200 Nicotine dependence, unspecified, uncomplicated: Secondary | ICD-10-CM | POA: Insufficient documentation

## 2013-11-14 DIAGNOSIS — R079 Chest pain, unspecified: Secondary | ICD-10-CM | POA: Insufficient documentation

## 2013-11-14 DIAGNOSIS — I10 Essential (primary) hypertension: Secondary | ICD-10-CM | POA: Insufficient documentation

## 2013-11-14 LAB — CBC
HEMATOCRIT: 53.5 % — AB (ref 39.0–52.0)
HEMOGLOBIN: 18.7 g/dL — AB (ref 13.0–17.0)
MCH: 31.6 pg (ref 26.0–34.0)
MCHC: 35 g/dL (ref 30.0–36.0)
MCV: 90.4 fL (ref 78.0–100.0)
Platelets: 241 10*3/uL (ref 150–400)
RBC: 5.92 MIL/uL — ABNORMAL HIGH (ref 4.22–5.81)
RDW: 14.8 % (ref 11.5–15.5)
WBC: 7.6 10*3/uL (ref 4.0–10.5)

## 2013-11-14 LAB — BASIC METABOLIC PANEL
BUN: 10 mg/dL (ref 6–23)
CALCIUM: 9.6 mg/dL (ref 8.4–10.5)
CO2: 24 mEq/L (ref 19–32)
Chloride: 94 mEq/L — ABNORMAL LOW (ref 96–112)
Creatinine, Ser: 1.31 mg/dL (ref 0.50–1.35)
GFR calc Af Amer: 65 mL/min — ABNORMAL LOW (ref >90)
GFR, EST NON AFRICAN AMERICAN: 56 mL/min — AB (ref >90)
GLUCOSE: 185 mg/dL — AB (ref 70–99)
Potassium: 3.9 mEq/L (ref 3.7–5.3)
SODIUM: 134 meq/L — AB (ref 137–147)

## 2013-11-14 LAB — PRO B NATRIURETIC PEPTIDE: Pro B Natriuretic peptide (BNP): 60.2 pg/mL (ref 0–125)

## 2013-11-14 LAB — I-STAT TROPONIN, ED: Troponin i, poc: 0.01 ng/mL (ref 0.00–0.08)

## 2013-11-14 NOTE — ED Notes (Signed)
Pt reports sudden onset of 7/10 central chest pressure with SOB and diaphoresis. States "every night at about the same time for last three days I have gotten this pain but it usually goes away." NAD. VSS. Recently admitted for chest pain. Hx: CHF, diabetes

## 2013-11-15 ENCOUNTER — Emergency Department (HOSPITAL_COMMUNITY)
Admission: EM | Admit: 2013-11-15 | Discharge: 2013-11-15 | Discharge status: Against Medical Advice | Payer: Self-pay | Attending: Emergency Medicine | Admitting: Emergency Medicine

## 2013-11-15 NOTE — ED Notes (Signed)
No answer, not in w/r, triage, h/w, entry way or b/r. Unable to find.

## 2013-11-29 ENCOUNTER — Ambulatory Visit: Payer: Self-pay

## 2013-11-30 ENCOUNTER — Inpatient Hospital Stay: Payer: Self-pay | Admitting: Cardiology

## 2014-04-06 DEATH — deceased

## 2014-08-03 IMAGING — CR DG CHEST 2V
2 series · 2 of 2 positions shown · non-contrast
Comparison: 09/27/2010

CLINICAL DATA: Mid chest pain

EXAM:
CHEST  2 VIEW

[w chest pa]
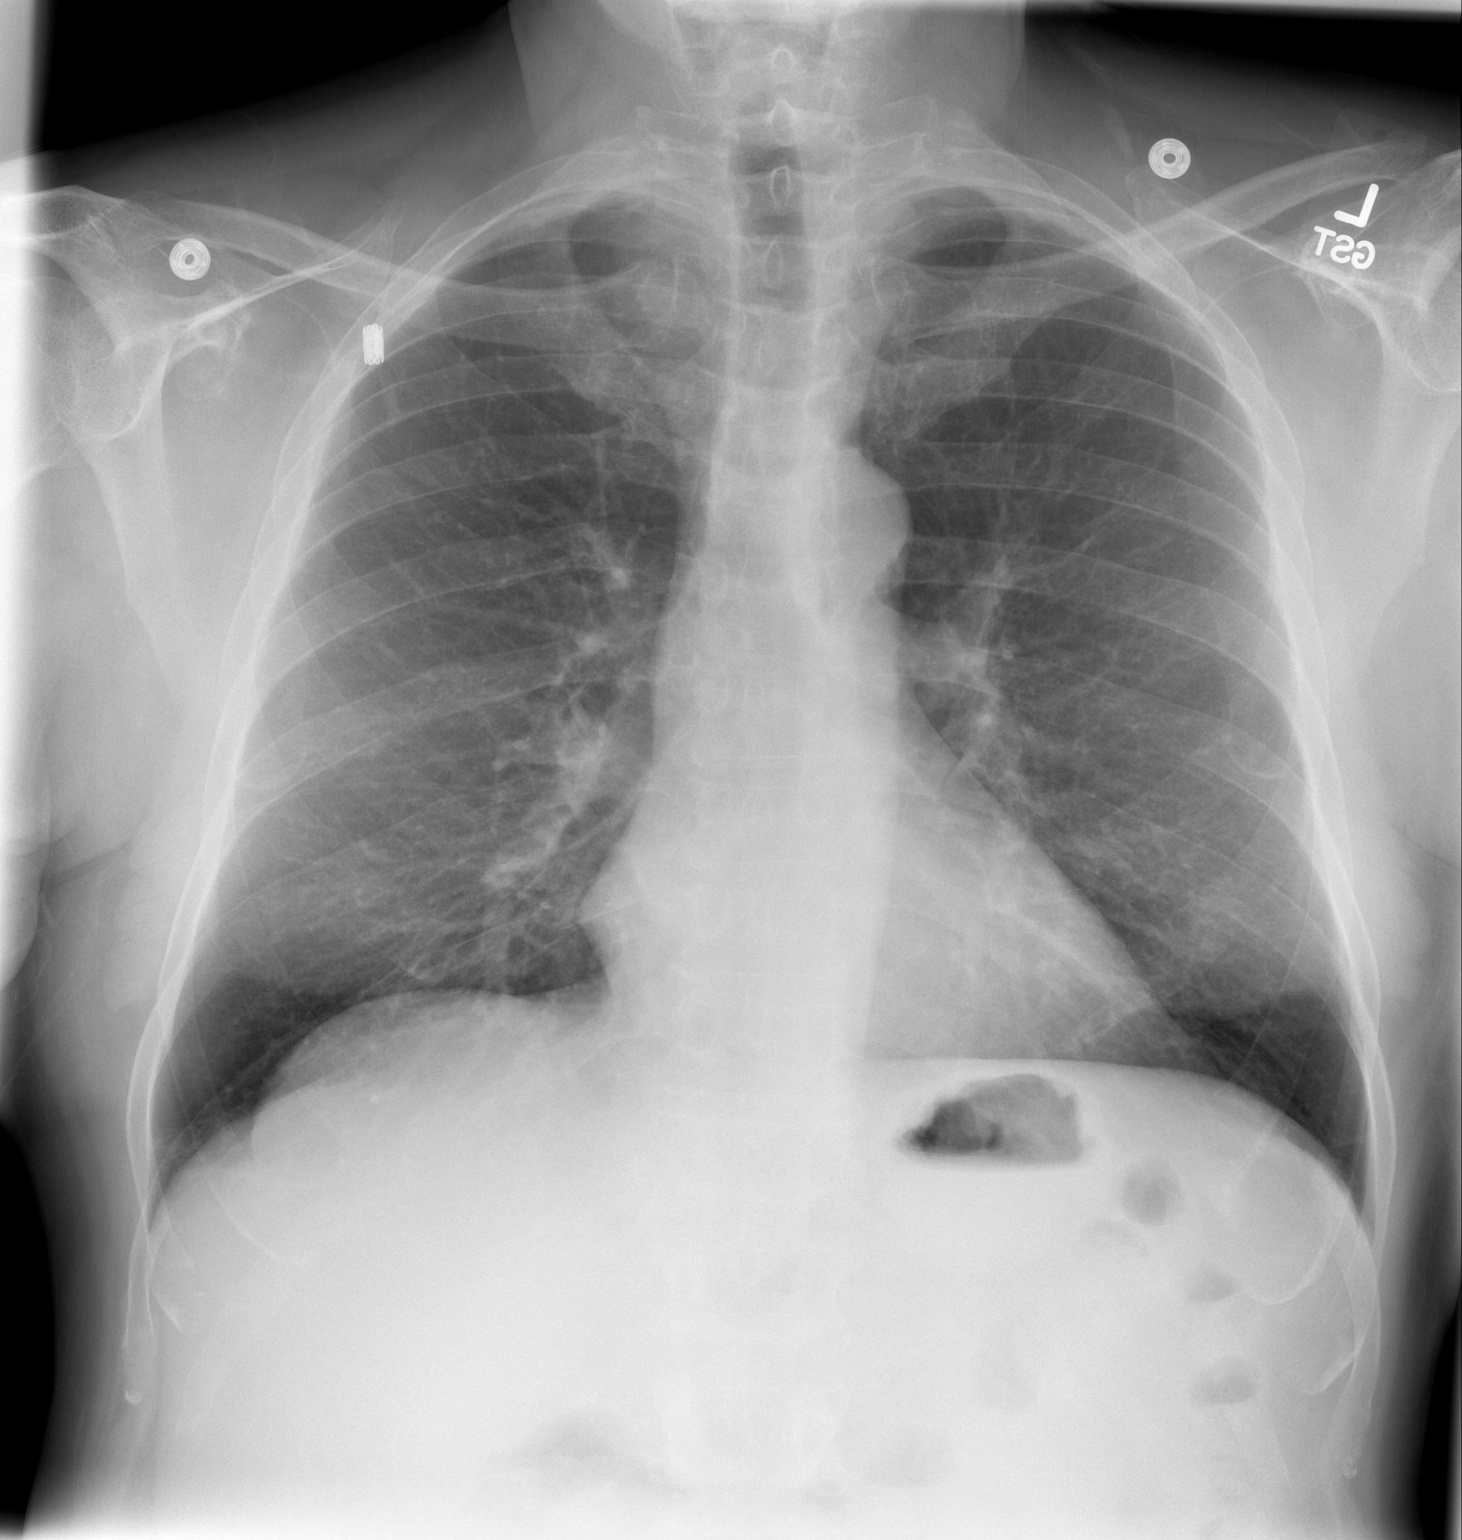

[w chest lat]
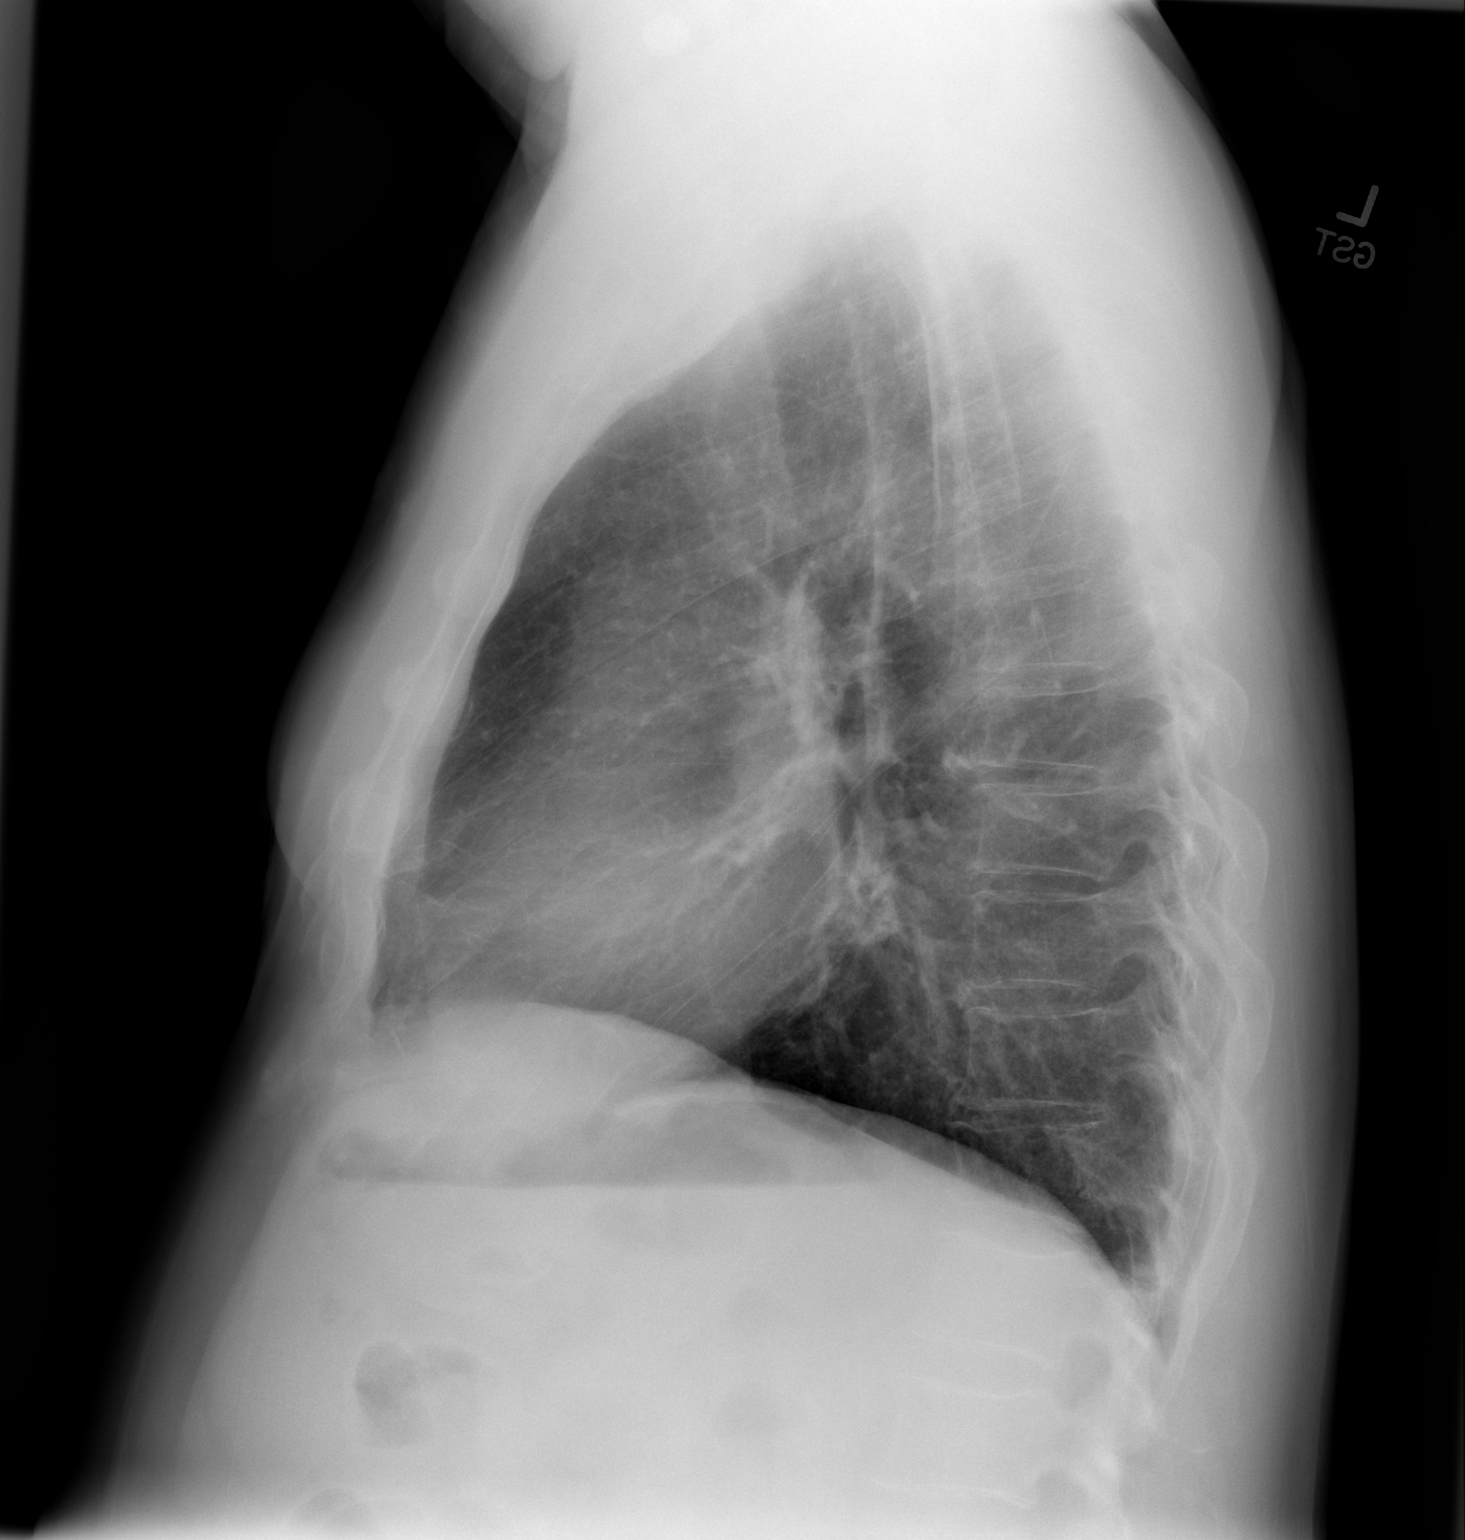

[2 of 2 positions shown; findings below may reference images not displayed]

FINDINGS: Normal heart size and mediastinal contours. No acute infiltrate or
edema. No effusion or pneumothorax. No acute osseous findings.
IMPRESSION: No active cardiopulmonary disease.
